# Patient Record
Sex: Male | Born: 1980 | Race: Black or African American | Hispanic: No | Marital: Single | Smoking: Current every day smoker
Health system: Southern US, Community
[De-identification: ages and names within clinical notes are randomized; demographics above are authoritative.]

## PROBLEM LIST (undated history)

## (undated) DIAGNOSIS — F419 Anxiety disorder, unspecified: Secondary | ICD-10-CM

## (undated) DIAGNOSIS — F32A Depression, unspecified: Secondary | ICD-10-CM

## (undated) DIAGNOSIS — G8929 Other chronic pain: Secondary | ICD-10-CM

## (undated) DIAGNOSIS — F329 Major depressive disorder, single episode, unspecified: Secondary | ICD-10-CM

## (undated) DIAGNOSIS — M549 Dorsalgia, unspecified: Secondary | ICD-10-CM

## (undated) HISTORY — PX: FOOT SURGERY: SHX648

---
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
## 2001-12-22 ENCOUNTER — Encounter: Payer: Self-pay | Admitting: Emergency Medicine

## 2001-12-22 ENCOUNTER — Emergency Department (HOSPITAL_COMMUNITY): Admission: EM | Admit: 2001-12-22 | Discharge: 2001-12-22 | Payer: Self-pay | Admitting: Emergency Medicine

## 2005-09-13 ENCOUNTER — Emergency Department (HOSPITAL_COMMUNITY): Admission: EM | Admit: 2005-09-13 | Discharge: 2005-09-13 | Payer: Self-pay | Admitting: Emergency Medicine

## 2007-03-22 ENCOUNTER — Emergency Department (HOSPITAL_COMMUNITY): Admission: EM | Admit: 2007-03-22 | Discharge: 2007-03-22 | Payer: Self-pay | Admitting: Emergency Medicine

## 2007-04-23 ENCOUNTER — Emergency Department (HOSPITAL_COMMUNITY): Admission: EM | Admit: 2007-04-23 | Discharge: 2007-04-23 | Payer: Self-pay | Admitting: *Deleted

## 2007-04-28 ENCOUNTER — Emergency Department (HOSPITAL_COMMUNITY): Admission: EM | Admit: 2007-04-28 | Discharge: 2007-04-28 | Payer: Self-pay | Admitting: Emergency Medicine

## 2007-07-27 ENCOUNTER — Emergency Department (HOSPITAL_COMMUNITY): Admission: EM | Admit: 2007-07-27 | Discharge: 2007-07-28 | Payer: Self-pay | Admitting: Emergency Medicine

## 2008-12-21 ENCOUNTER — Emergency Department (HOSPITAL_COMMUNITY): Admission: EM | Admit: 2008-12-21 | Discharge: 2008-12-21 | Payer: Self-pay | Admitting: Family Medicine

## 2009-04-29 ENCOUNTER — Encounter (INDEPENDENT_AMBULATORY_CARE_PROVIDER_SITE_OTHER): Payer: Self-pay | Admitting: *Deleted

## 2009-07-02 ENCOUNTER — Ambulatory Visit (HOSPITAL_COMMUNITY): Admission: RE | Admit: 2009-07-02 | Discharge: 2009-07-02 | Payer: Self-pay | Admitting: Family Medicine

## 2009-07-02 ENCOUNTER — Ambulatory Visit: Payer: Self-pay | Admitting: Family Medicine

## 2009-07-02 DIAGNOSIS — F411 Generalized anxiety disorder: Secondary | ICD-10-CM | POA: Insufficient documentation

## 2009-07-02 DIAGNOSIS — F39 Unspecified mood [affective] disorder: Secondary | ICD-10-CM | POA: Insufficient documentation

## 2009-07-07 ENCOUNTER — Encounter: Payer: Self-pay | Admitting: Family Medicine

## 2009-08-21 ENCOUNTER — Encounter (INDEPENDENT_AMBULATORY_CARE_PROVIDER_SITE_OTHER): Payer: Self-pay | Admitting: *Deleted

## 2009-08-21 DIAGNOSIS — F17200 Nicotine dependence, unspecified, uncomplicated: Secondary | ICD-10-CM | POA: Insufficient documentation

## 2009-08-25 ENCOUNTER — Emergency Department (HOSPITAL_COMMUNITY): Admission: EM | Admit: 2009-08-25 | Discharge: 2009-08-26 | Payer: Self-pay | Admitting: Emergency Medicine

## 2010-05-26 ENCOUNTER — Ambulatory Visit: Payer: Self-pay | Admitting: Family Medicine

## 2010-05-26 DIAGNOSIS — H6590 Unspecified nonsuppurative otitis media, unspecified ear: Secondary | ICD-10-CM | POA: Insufficient documentation

## 2010-06-02 ENCOUNTER — Telehealth: Payer: Self-pay | Admitting: *Deleted

## 2010-06-08 ENCOUNTER — Emergency Department (HOSPITAL_COMMUNITY): Admission: EM | Admit: 2010-06-08 | Discharge: 2010-06-08 | Payer: Self-pay | Admitting: Family Medicine

## 2010-07-11 ENCOUNTER — Ambulatory Visit: Payer: Self-pay | Admitting: Family Medicine

## 2010-08-04 ENCOUNTER — Encounter: Payer: Self-pay | Admitting: Family Medicine

## 2010-08-04 ENCOUNTER — Ambulatory Visit: Payer: Self-pay | Admitting: Family Medicine

## 2010-08-04 DIAGNOSIS — J329 Chronic sinusitis, unspecified: Secondary | ICD-10-CM | POA: Insufficient documentation

## 2010-08-04 DIAGNOSIS — F1210 Cannabis abuse, uncomplicated: Secondary | ICD-10-CM | POA: Insufficient documentation

## 2010-08-04 LAB — CONVERTED CEMR LAB
HDL: 44 mg/dL (ref >39)
LDL Cholesterol: 108 mg/dL — ABNORMAL HIGH (ref 0–99)
Total CHOL/HDL Ratio: 3.8

## 2010-08-08 ENCOUNTER — Encounter: Payer: Self-pay | Admitting: Family Medicine

## 2010-12-15 NOTE — Assessment & Plan Note (Signed)
Summary: cpe,df   Vital Signs:  Patient profile:   30 year old male Height:      65.5 inches Weight:      153 pounds BMI:     25.16 BSA:     1.78 Temp:     99.0 degrees F Pulse rate:   85 / minute BP sitting:   91 / 50  Vitals Entered By: Jone Baseman CMA (August 04, 2010 10:14 AM) CC: CPE Is Patient Diabetic? No Pain Assessment Patient in pain? no        Primary Care Provider:  . [facility]  CC:  CPE.  History of Present Illness: Annual physical  Concerns 1. Chronic sinusitis:  Has had this for the past couple of months.  Was seen here and given Flonase which helps some but it hasn't gone away.  Seems to be worse at night and he gets very congested at night.  Health maintanence 1. Tobacco use:  Smokes 1 pack in 1 week.  Willing to try to cut down 2. Marijuana use:  uses it to help him calm down.  Feels like he has anxiety issues. 3. Exercise:  goes to the gym regularly 4. Diet: Standard American Diet  Habits & Providers  Alcohol-Tobacco-Diet     Alcohol drinks/day: <1     Alcohol Counseling: not indicated; use of alcohol is not excessive or problematic     Alcohol type: beer     >5/day in last 3 mos: no     Tobacco Status: current     Tobacco Counseling: to quit use of tobacco products     Cigarette Packs/Day: 0.25     Year Started: 1997     Pack years: 6  Current Medications (verified): 1)  Fluticasone Propionate 50 Mcg/act Susp (Fluticasone Propionate) .... 2 Sprays Each Nostril Once Daily. 2)  Zyrtec Hives Relief 10 Mg Tabs (Cetirizine Hcl) .Marland Kitchen.. 1 Tab By Mouth Daily At Night 3)  Fluoxetine Hcl 10 Mg Caps (Fluoxetine Hcl) .Marland Kitchen.. 1 Tab By Mouth Daily To Help With Anxiety  Allergies: No Known Drug Allergies  Past History:  Past Medical History: Reviewed history from 05/26/2010 and no changes required. Blood transfusion- at birth Depression/Anxiety vs mood disorder  TOBACCO USER (ICD-305.1)  Social History: Reviewed history from 07/02/2009  and no changes required. Lives with wife Jermond Burkemper dob 04/20/1981) and daughter Scott Vanderveer dob 10/28/2008).  Works as Administrator.  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  Smokes 1-2 cigarettes per day.  Drinks beer or liquor on special occassions.  Uses MJ daily.  Lifts weight for approximately 20 minutes 4 times per week.    Review of Systems  The patient denies fever, weight loss, chest pain, dyspnea on exertion, prolonged cough, headaches, and abdominal pain.    Physical Exam  General:  vitals reviewed.  well appearing, no acute distress Eyes:  normal conjunctiva Ears:  TM normal bilaterally Nose:  no sinus pain or pressure.  Some nasal mucosal erythema Mouth:  Oral mucosa and oropharynx without lesions or exudates.  Teeth in good repair. Neck:  No deformities, masses, or tenderness noted. Lungs:  Normal respiratory effort, chest expands symmetrically. Lungs are clear to auscultation, no crackles or wheezes. Heart:  normal rate and regular rhythm.   Abdomen:  Bowel sounds positive,abdomen soft and non-tender without masses, organomegaly or hernias noted. Msk:  normal ROM.   Extremities:  No clubbing, cyanosis, edema, or deformity noted with normal full range of motion of all joints.  Neurologic:  No cranial nerve deficits noted. Station and gait are normal. Plantar reflexes are down-going bilaterally. DTRs are symmetrical throughout. Sensory, motor and coordinative functions appear intact. Psych:  not anxious appearing and not depressed appearing.     Impression & Recommendations:  Problem # 1:  PHYSICAL EXAMINATION (ICD-V70.0) Assessment Unchanged  Will check lipids.  Orders: [facility] - Est  18-39 yrs ([address]35)  Problem # 2:  TOBACCO USER (ICD-305.1) Assessment: Unchanged Advised to quit which he will try to do  Problem # 3:  CANNABIS ABUSE (ICD-305.20) Will start Prozac to help with anxiety and see if tha can help him cut down.  Advised him to give the Prozac 6  weeks to start working and then try to cut back on the MJ.  Problem # 4:  SINUSITIS, CHRONIC (ICD-473.9) Assessment: New Will treat with Zyrtec and Flonase The following medications were removed from the medication list:    Doxycycline Hyclate 100 Mg Tabs (Doxycycline hyclate) ..... One two times a day for 7 days His updated medication list for this problem includes:    Fluticasone Propionate 50 Mcg/act Susp (Fluticasone propionate) .Marland Kitchen... 2 sprays each nostril once daily.  Complete Medication List: 1)  Fluticasone Propionate 50 Mcg/act Susp (Fluticasone propionate) .... 2 sprays each nostril once daily. 2)  Zyrtec Hives Relief 10 Mg Tabs (Cetirizine hcl) .Marland Kitchen.. 1 tab by mouth daily at night 3)  Fluoxetine Hcl 10 Mg Caps (Fluoxetine hcl) .Marland Kitchen.. 1 tab by mouth daily to help with anxiety  Other Orders: Lipid-[facility] ([id number])  Patient Instructions: 1)  It was nice to meet you  2)  We will get some baseline blood work today 3)  I am going to start you on an allergy medicine for your sinuses 4)  I am also going to start you on another medicine to help calm you down 5)  If you can try and cut back on your smoking 6)  Please schedule a follow up appointment in 2 months to see how you are doing Prescriptions: FLUOXETINE HCL 10 MG CAPS (FLUOXETINE HCL) 1 tab by mouth daily to help with anxiety  #30 x 3   Entered and Authorized by:   Angelena Sole MD   Signed by:   Angelena Sole MD on 08/04/2010   Method used:   Electronically to        [facility] [address] (retail)       [phone]       [place], [place]  [address]       Ph: [phone]       Fax: [phone]   RxID:   [id number] ZYRTEC HIVES RELIEF 10 MG TABS (CETIRIZINE HCL) 1 tab by mouth daily at night  #30 x 3   Entered and Authorized by:   Angelena Sole MD   Signed by:   Angelena Sole MD on 08/04/2010   Method used:   Electronically to        [facility] [address]  (retail)       [phone]       [place], [place]  [address]       Ph: [phone]       Fax: [phone]   RxID:   [id number]   Prevention & Chronic Care Immunizations   Influenza vaccine: Not documented    Tetanus booster: 07/02/2009: Tdap    Pneumococcal vaccine: Not documented  Other  Screening   Smoking status: current  (08/04/2010)

## 2010-12-15 NOTE — Letter (Signed)
Summary: Generic Letter  [facility]  [address]   [place], [place] [address]   Phone: [phone]  Fax: [phone]    08/08/2010  Justin Riggs [address] [place], [place]  [address]  Dear Mr. Fidalgo,  Here is your cholesterol lab results.  Everything looked good.  Tests: (1) Lipid Profile (01027)   Cholesterol               166 mg/dL                   2-536   Triglyceride              72 mg/dL                    <644   HDL Cholesterol           44 mg/dL                    >03   Total Chol/HDL Ratio      3.8 Ratio   VLDL Cholesterol (Calc)    14 mg/dL                    4-74   LDL Cholesterol (Calc)[H]  108 mg/dL                   2-59           Sincerely,   Angelena Sole MD  Appended Document: Generic Letter mailed.  Appended Document: Generic Letter Letter returned unable to forward.

## 2010-12-15 NOTE — Assessment & Plan Note (Signed)
Summary: sinus problems/eo   Vital Signs:  Patient profile:   30 year old male Height:      67 inches Weight:      154.6 pounds BMI:     24.30 Temp:     98.5 degrees F oral Pulse rate:   79 / minute BP sitting:   110 / 68  (right arm) Cuff size:   regular  Vitals Entered By: Garen Grams LPN (July 11, 2010 10:32 AM) CC: headache, ear pain, drainage, sore throat Is Patient Diabetic? No   Primary Care Provider:  . [facility]  CC:  headache, ear pain, drainage, and sore throat.  History of Present Illness: Highly anxious about earache and sinus problems.  Smokes but does not want to quit.  Seen 5 weeks ago and treated with nasal steroids and symptoms improved.  He quit using the nasal spray and does not want to have to pay for it, thought it was too expensive.  Denies fever.  Habits & Providers  Alcohol-Tobacco-Diet     Tobacco Status: current     Tobacco Counseling: to quit use of tobacco products  Current Medications (verified): 1)  Fluticasone Propionate 50 Mcg/act Susp (Fluticasone Propionate) .... 2 Sprays Each Nostril Once Daily. 2)  Doxycycline Hyclate 100 Mg Tabs (Doxycycline Hyclate) .... One Two Times A Day For 7 Days  Allergies: No Known Drug Allergies  Review of Systems General:  Denies chills and fever. ENT:  Complains of ear discharge, earache, nasal congestion, ringing in ears, and sore throat. Resp:  Denies cough and wheezing.  Physical Exam  General:  Well-developed,well-nourished,in no acute distress; alert,appropriate and cooperative throughout examination Eyes:  clear no drainage Ears:  TM red and retracted Nose:  mucosal erythema.   Mouth:  pharyngeal erythema.   Neck:  No deformities, masses, or tenderness noted. Lungs:  normal respiratory effort and normal breath sounds.   Heart:  normal rate and regular rhythm.     Impression & Recommendations:  Problem # 1:  SINUSITIS, ACUTE (ICD-461.9) counseled to quit smoking and to use nasal  steroid (dobut he will refill ), treat with one week of doxy, since this has been going on for 5 weeks. His updated medication list for this problem includes:    Fluticasone Propionate 50 Mcg/act Susp (Fluticasone propionate) .Marland Kitchen... 2 sprays each nostril once daily.    Doxycycline Hyclate 100 Mg Tabs (Doxycycline hyclate) ..... One two times a day for 7 days  Orders: [facility]- Est Level  3 ([address]191)  Complete Medication List: 1)  Fluticasone Propionate 50 Mcg/act Susp (Fluticasone propionate) .... 2 sprays each nostril once daily. 2)  Doxycycline Hyclate 100 Mg Tabs (Doxycycline hyclate) .... One two times a day for 7 days  Patient Instructions: 1)  Zyrtec (cetirizine 10 mg daily) at bedtime Prescriptions: DOXYCYCLINE HYCLATE 100 MG TABS (DOXYCYCLINE HYCLATE) one two times a day for 7 days  #20 x 0   Entered and Authorized by:   Luretha Murphy NP   Signed by:   Luretha Murphy NP on 07/11/2010   Method used:   Electronically to        [facility] [address] (retail)       [phone]       [place], [place]  [address]       Ph: [phone]       Fax: [phone]   RxID:   [id number]

## 2010-12-15 NOTE — Assessment & Plan Note (Signed)
Summary: bloating for 2 wks/ear ache/blue team pt/eo   Vital Signs:  Patient profile:   30 year old male Weight:      153 pounds BMI:     24.05 Temp:     98.4 degrees F oral Pulse rate:   74 / minute BP sitting:   110 / 80  (left arm)  Vitals Entered By: Jimmy Footman, CMA (May 26, 2010 9:44 AM) CC: ear pain/popping feeling x3weeks Is Patient Diabetic? No Pain Assessment Patient in pain? yes     Location: left ear Intensity: 4 Type: aching, popping   Primary Care Provider:  . [facility]  CC:  ear pain/popping feeling x3weeks.  History of Present Illness: ear: left ear with some pain, pressure sensation, popping now for about 3 weeks.  also states it feels like there is fluid in there and like he is hearing an echo.  he noted it was particularly worse when he dove to the bottom of a swimming pool.  he has tried tilting his head to the side to see if there was anything in there but it didn't help.  he denies fevers, denies drainage from the ear.  he's had no sick contacts.  he denies sneezing, runny nose or cough but has noted a little congestion.  he hasn't been using qtips in his ears.      Habits & Providers  Alcohol-Tobacco-Diet     Tobacco Status: current     Tobacco Counseling: to quit use of tobacco products  Current Medications (verified): 1)  Fluticasone Propionate 50 Mcg/act Susp (Fluticasone Propionate) .... 2 Sprays Each Nostril Once Daily.  Allergies (verified): No Known Drug Allergies  Past History:  Past Medical History: Blood transfusion- at birth Depression/Anxiety vs mood disorder  TOBACCO USER (ICD-305.1)  Review of Systems       per HPI.  does note however he has been under a great deal of stress recently.  denies SI/HI.  reports some appetite changes and also sleeping difficulties.  didn't take med previously prescribed for long because it made him too sleepy  Physical Exam  General:  Well-developed,well-nourished,in no acute distress;  alert,appropriate and cooperative throughout examination VS noted - WNL Head:  Normocephalic and atraumatic without obvious abnormalities.  Ears:  left ear with clear fluid posterior to the TM with mild bulging.  canal clear.  no erythema.   R ear with TM WNL Nose:  mild bogginess of turbinates. Mouth:  Oral mucosa and oropharynx without lesions or exudates.  Teeth in good repair.   Impression & Recommendations:  Problem # 1:  OTITIS MEDIA, SEROUS, LEFT (ICD-381.4) Assessment New  advised decongestants, also nasal steroid.  return if worsens.   no indication for abx at this time.  Orders: [facility]- Est Level  3 (04540)  Complete Medication List: 1)  Fluticasone Propionate 50 Mcg/act Susp (Fluticasone propionate) .... 2 sprays each nostril once daily.  Patient Instructions: 1)  For the ear I would recommend using a claritin-D, zyrtec-D or allergra-D (the kind you have to ask for at the pharmacy)  daily for the next few weeks to help open up your nose so the ears can drain. 2)  I also want you to use a nasal spray daily - it doesn't work instantly and often takes about a month to really see full effect so keep using it.   3)  Please schedule an appt with Dr Pascal Lux to discuss your stress and mood problems.   4)  Also schedule an  appt for a complete physical at some point.  Prescriptions: FLUTICASONE PROPIONATE 50 MCG/ACT SUSP (FLUTICASONE PROPIONATE) 2 sprays each nostril once daily.  #1 x 6   Entered and Authorized by:   Ancil Boozer  MD   Signed by:   Ancil Boozer  MD on 05/26/2010   Method used:   Electronically to        [facility] [address] (retail)       [phone]       [place], [place]  [address]       Ph: [phone]       Fax: [phone]   RxID:   [id number]

## 2010-12-15 NOTE — Progress Notes (Signed)
Summary: triage  Phone Note Call from Patient Call back at [phone]   Caller: Patient Summary of Call: Was just here a week ago and had fluid in ears got nasal spray, but now having sharpe pains in his ear and neck pain on both sides.   Initial call taken by: Clydell Hakim,  June 02, 2010 8:47 AM  Follow-up for Phone Call        LM Follow-up by: Golden Circle RN,  June 02, 2010 8:51 AM  Additional Follow-up for Phone Call Additional follow up Details #1::        he is unable to come in today. will be here at 8:30 workin. has not taken any meds. urged him to take tylenol or ib with food until seen Additional Follow-up by: Golden Circle RN,  June 02, 2010 10:46 AM

## 2010-12-20 ENCOUNTER — Emergency Department (HOSPITAL_COMMUNITY)
Admission: EM | Admit: 2010-12-20 | Discharge: 2010-12-20 | Disposition: A | Discharge status: Home / Self Care | Payer: BC Managed Care – PPO | Attending: Emergency Medicine | Admitting: Emergency Medicine

## 2010-12-20 DIAGNOSIS — H00019 Hordeolum externum unspecified eye, unspecified eyelid: Secondary | ICD-10-CM | POA: Insufficient documentation

## 2011-08-25 LAB — CBC
HCT: 49
Hemoglobin: 16.6
MCHC: 34
MCV: 94.5
RDW: 13.2

## 2011-08-25 LAB — URINALYSIS, ROUTINE W REFLEX MICROSCOPIC
Ketones, ur: 40 — AB
Leukocytes, UA: NEGATIVE
Nitrite: NEGATIVE
Protein, ur: 100 — AB
pH: 6

## 2011-08-25 LAB — DIFFERENTIAL
Basophils Relative: 0
Lymphocytes Relative: 17
Lymphs Abs: 0.9
Monocytes Relative: 8
Neutro Abs: 4
Neutrophils Relative %: 75

## 2011-08-25 LAB — COMPREHENSIVE METABOLIC PANEL
Alkaline Phosphatase: 63
BUN: 11
Calcium: 9.9
Creatinine, Ser: 1.12
Glucose, Bld: 112 — ABNORMAL HIGH
Total Protein: 8.6 — ABNORMAL HIGH

## 2011-08-25 LAB — URINE MICROSCOPIC-ADD ON

## 2011-08-31 LAB — URINALYSIS, ROUTINE W REFLEX MICROSCOPIC
Nitrite: NEGATIVE
Specific Gravity, Urine: 1.045 — ABNORMAL HIGH
Urobilinogen, UA: 1

## 2011-08-31 LAB — RAPID URINE DRUG SCREEN, HOSP PERFORMED
Amphetamines: NOT DETECTED
Tetrahydrocannabinol: POSITIVE — AB

## 2011-08-31 LAB — CBC
Hemoglobin: 15.2
RDW: 13.3

## 2011-08-31 LAB — DIFFERENTIAL
Basophils Absolute: 0
Lymphocytes Relative: 25
Monocytes Absolute: 0.6
Neutro Abs: 3.7

## 2011-08-31 LAB — I-STAT 8, (EC8 V) (CONVERTED LAB)
Acid-base deficit: 2
Bicarbonate: 21.4
Chloride: 106
HCT: 50
Operator id: 279831
pCO2, Ven: 32.4 — ABNORMAL LOW

## 2011-08-31 LAB — URINE MICROSCOPIC-ADD ON

## 2011-08-31 LAB — POCT I-STAT CREATININE: Creatinine, Ser: 1.5

## 2011-08-31 LAB — SALICYLATE LEVEL: Salicylate Lvl: 8.5

## 2011-08-31 LAB — ACETAMINOPHEN LEVEL: Acetaminophen (Tylenol), Serum: 35.9 — ABNORMAL HIGH

## 2011-08-31 LAB — ETHANOL: Alcohol, Ethyl (B): <5

## 2012-04-08 ENCOUNTER — Emergency Department (HOSPITAL_COMMUNITY): Payer: Self-pay

## 2012-04-08 ENCOUNTER — Emergency Department (HOSPITAL_COMMUNITY)
Admission: EM | Admit: 2012-04-08 | Discharge: 2012-04-08 | Disposition: A | Discharge status: Home / Self Care | Payer: Self-pay | Attending: Emergency Medicine | Admitting: Emergency Medicine

## 2012-04-08 ENCOUNTER — Encounter (HOSPITAL_COMMUNITY): Payer: Self-pay | Admitting: Emergency Medicine

## 2012-04-08 DIAGNOSIS — S62339A Displaced fracture of neck of unspecified metacarpal bone, initial encounter for closed fracture: Secondary | ICD-10-CM | POA: Insufficient documentation

## 2012-04-08 DIAGNOSIS — X838XXA Intentional self-harm by other specified means, initial encounter: Secondary | ICD-10-CM | POA: Insufficient documentation

## 2012-04-08 DIAGNOSIS — S62309A Unspecified fracture of unspecified metacarpal bone, initial encounter for closed fracture: Secondary | ICD-10-CM

## 2012-04-08 NOTE — ED Notes (Signed)
Paged ortho requesting finger splint and wrist splint.

## 2012-04-08 NOTE — ED Notes (Signed)
Pt sts punched wall 4 days ago with right fist and now c/o continued pain and swelling; pt sts some improvemetn

## 2012-04-08 NOTE — ED Provider Notes (Addendum)
History   This chart was scribed for Gwyneth Sprout, MD by Brooks Sailors. The patient was seen in room CDU11/CDU11. Patient's care was started at 1108.   CSN: [id number]  Arrival date & time 04/08/12  1108   First MD Initiated Contact with Patient 04/08/12 1139      Chief Complaint  Patient presents with  . Hand Pain    (Consider location/radiation/quality/duration/timing/severity/associated sxs/prior treatment) HPI  Justin Riggs is a 31 y.o. male who presents to the Emergency Department complaining of constant severe right hand pain following an injury 4 days ago with associated swelling. Patient punched a wall. Pt says he has normal sensations and movement. Nothing makes it worse and nothing makes the pain better. Pt notes that swelling has minimally improved.    History reviewed. No pertinent past medical history.  History reviewed. No pertinent past surgical history.  History reviewed. No pertinent family history.  History  Substance Use Topics  . Smoking status: Current Everyday Smoker  . Smokeless tobacco: Not on file  . Alcohol Use: Yes      Review of Systems  All other systems reviewed and are negative.    Allergies  Review of patient's allergies indicates no known allergies.  Home Medications   No current outpatient prescriptions on file.  BP 114/65  Pulse 75  Temp(Src) 98 F (36.7 C) (Oral)  Resp 20  SpO2 97%  Physical Exam  Nursing note and vitals reviewed. Constitutional: He is oriented to person, place, and time. He appears well-developed and well-nourished. No distress.  HENT:  Head: Normocephalic and atraumatic.  Eyes: EOM are normal.  Neck: Neck supple. No tracheal deviation present.  Cardiovascular: Normal rate.   Pulmonary/Chest: Effort normal. No respiratory distress.  Musculoskeletal: Normal range of motion.       Right hand: He exhibits tenderness.       Good grip strength, Normal cap refill, No finger deviation with making  a fist, Normal sensation, Good pulse, no elbow or wrist pain.   Neurological: He is alert and oriented to person, place, and time.  Skin: Skin is warm and dry.  Psychiatric: He has a normal mood and affect. His behavior is normal.    ED Course  Procedures (including critical care time)  Pt seen at 1150, will be sent for right hand x-ray soon.  1300 Pt informed of xray results and course of care.   Labs Reviewed - No data to display Dg Hand Complete Right  04/08/2012  *RADIOLOGY REPORT*  Clinical Data: Injury  RIGHT HAND - COMPLETE 3+ VIEW  Comparison: None.  Findings: Luno-triquetral coalition. Acute fracture involving the head of the second metacarpal.  Small bony fragments are minimally displaced.  IMPRESSION: Acute fracture of the distal second metacarpal.  Original Report Authenticated By: Donavan Burnet, M.D.     1. Metacarpal bone fracture       MDM   Patient with mechanical injury to his hand after he punched a wall 5 days ago. Continue swelling and pain distal to his second MCP joint. He has normal pulses and sensation. Good movements and no deviation of his fingers when making a fist. Plain film shows an acute fracture of the distal second metacarpal without angulation or other complicating features. Patient was placed in a finger splint and Velcro wrist splint as he did not want a plaster cast due to his line of work.      I personally performed the services described in this documentation, which  was scribed in my presence.  The recorded information has been reviewed and considered.    Gwyneth Sprout, MD 04/08/12 1306  Gwyneth Sprout, MD 04/08/12 0865

## 2012-04-08 NOTE — Progress Notes (Signed)
Orthopedic Tech Progress Note Patient Details:  Justin Riggs 1980-12-11 [id number]  Ortho Devices Type of Ortho Device: Finger splint;Velcro wrist splint Ortho Device/Splint Location: (R) UE Ortho Device/Splint Interventions: Application   [name] 04/08/2012, 1:23 PM

## 2012-04-08 NOTE — Discharge Instructions (Signed)
Cast or Splint Care Casts and splints support injured limbs and keep bones from moving while they heal.  HOME CARE  Keep the cast or splint uncovered during the drying period.   A plaster cast can take 24 to 48 hours to dry.   A fiberglass cast will dry in less than 1 hour.   Do not rest the cast on anything harder than a pillow for 24 hours.   Do not put weight on your injured limb. Do not put pressure on the cast. Wait for your doctor's approval.   Keep the cast or splint dry.   Cover the cast or splint with a plastic bag during baths or wet weather.   If you have a cast over your chest and belly (trunk), take sponge baths until the cast is taken off.   Keep your cast or splint clean. Wash a dirty cast with a damp cloth.   Do not put any objects under your cast or splint. Do not scratch the skin under the cast with an object.   Do not take out the padding from inside your cast.   Exercise your joints near the cast as told by your doctor.   Raise (elevate) your injured limb on 1 or 2 pillows for the first 1 to 3 days.  GET HELP RIGHT AWAY IF:  Your cast or splint cracks.   Your cast or splint is too tight or too loose.   You itch badly under the cast.   Your cast gets wet or has a soft spot.   You have a bad smell coming from the cast.   You get an object stuck under the cast.   Your skin around the cast becomes red or raw.   You have new or more pain after the cast is put on.   You have fluid leaking through the cast.   You cannot move your fingers or toes.   Your fingers or toes turn colors or are cool, painful, or puffy (swollen).   You have tingling or lose feeling (numbness) around the injured area.   You have pain or pressure under the cast.   You have trouble breathing or have shortness of breath.   You have chest pain.  MAKE SURE YOU:  Understand these instructions.   Will watch your condition.   Will get help right away if you are not doing  well or get worse.  Document Released: 03/01/2011 Document Revised: 10/19/2011 Document Reviewed: 03/01/2011 Exit[employer] Patient Information 2012 [employer].Hand Fracture Your caregiver has diagnosed you with a fractured (broken) bone in your hand. If the bones are in good position and the hand is properly immobilized and rested, these injuries will usually heal in 3 to 6 weeks. A cast, splint, or bulky bandage is usually applied to keep the fracture site from moving. Do not remove the splint or cast until your caregiver approves. If the fracture is unstable or the bones are not aligned properly, surgery may be needed. Keep your hand raised (elevated) above the level of your heart as much as possible for the next 2 to 3 days until the swelling and pain are better. Apply ice packs for 15 to 20 minutes every 3 to 4 hours to help control the pain and swelling. See your caregiver or an orthopedic specialist as directed for follow-up care to make sure the fracture is beginning to heal properly. SEEK IMMEDIATE MEDICAL CARE IF:   You notice your fingers are cold, numb,   crooked, or the pain of your injury is severe.   You are not improving or seem to be getting worse.   You have questions or concerns.  Document Released: 12/07/2004 Document Revised: 10/19/2011 Document Reviewed: 04/27/2009 Exit[employer] Patient Information 2012 [employer]. 

## 2012-04-08 NOTE — ED Notes (Signed)
PT HAS RETURNED FROM XRAY. AWAIT RESULTS

## 2012-12-16 ENCOUNTER — Encounter (HOSPITAL_COMMUNITY): Payer: Self-pay | Admitting: Emergency Medicine

## 2012-12-16 ENCOUNTER — Emergency Department (HOSPITAL_COMMUNITY): Payer: BC Managed Care – PPO

## 2012-12-16 ENCOUNTER — Emergency Department (HOSPITAL_COMMUNITY)
Admission: EM | Admit: 2012-12-16 | Discharge: 2012-12-16 | Disposition: A | Discharge status: Home / Self Care | Payer: BC Managed Care – PPO | Attending: Emergency Medicine | Admitting: Emergency Medicine

## 2012-12-16 DIAGNOSIS — R079 Chest pain, unspecified: Secondary | ICD-10-CM

## 2012-12-16 DIAGNOSIS — R072 Precordial pain: Secondary | ICD-10-CM | POA: Insufficient documentation

## 2012-12-16 DIAGNOSIS — J029 Acute pharyngitis, unspecified: Secondary | ICD-10-CM | POA: Insufficient documentation

## 2012-12-16 DIAGNOSIS — F17200 Nicotine dependence, unspecified, uncomplicated: Secondary | ICD-10-CM | POA: Insufficient documentation

## 2012-12-16 MED ORDER — NAPROXEN 500 MG PO TABS: 500.0000 mg | ORAL_TABLET | Freq: Two times a day (BID) | ORAL | Status: DC

## 2012-12-16 MED ORDER — KETOROLAC TROMETHAMINE 60 MG/2ML IM SOLN
60.0000 mg | Freq: Once | INTRAMUSCULAR | Status: AC
  Administered 2012-12-16: 60 mg via INTRAMUSCULAR
  Filled 2012-12-16: qty 2

## 2012-12-16 MED ORDER — CYCLOBENZAPRINE HCL 5 MG PO TABS: 5.0000 mg | ORAL_TABLET | Freq: Three times a day (TID) | ORAL | Status: DC | PRN

## 2012-12-16 NOTE — ED Provider Notes (Signed)
History     CSN: [id number]  Arrival date & time 12/16/12  0454   First MD Initiated Contact with Patient 12/16/12 [phone]      Chief Complaint  Patient presents with  . Sore Throat  . Chest Pain    (Consider location/radiation/quality/duration/timing/severity/associated sxs/prior treatment) HPI Comments: 32 year old male who states that 3 days ago he started to have pain in the middle of his chest after eating a very large meal. He did not have any food instructions and has been able to eat and drink since that time. He complains of intermittent shooting pains that start in his midchest and run up his throat into his years. Nothing seems to make this come on, nothing seems to make it go away, it lasts only 2-3 seconds and then completely resolves. He has no coughing, no shortness of breath, no nausea or vomiting and no other headaches, sore throat, nasal congestion, blurred vision, weakness, numbness, change in bowel habits or bladder habits. He has no other significant medical problems.  The history is provided by the patient.    History reviewed. No pertinent past medical history.  Past Surgical History  Procedure Date  . Foot surgery     right foot    History reviewed. No pertinent family history.  History  Substance Use Topics  . Smoking status: Current Every Day Smoker -- 0.5 packs/day  . Smokeless tobacco: Not on file  . Alcohol Use: Yes      Review of Systems  All other systems reviewed and are negative.    Allergies  Review of patient's allergies indicates no known allergies.  Home Medications   Current Outpatient Rx  Name  Route  Sig  Dispense  Refill  . CALCIUM CARBONATE ANTACID 500 MG PO CHEW   Oral   Chew 2 tablets by mouth daily as needed. For stomach pain         . ADULT MULTIVITAMIN W/MINERALS CH   Oral   Take 1 tablet by mouth daily.         . CYCLOBENZAPRINE HCL 5 MG PO TABS   Oral   Take 1 tablet (5 mg total) by mouth 3 (three) times  daily as needed for muscle spasms.   30 tablet   0   . NAPROXEN 500 MG PO TABS   Oral   Take 1 tablet (500 mg total) by mouth 2 (two) times daily with a meal.   30 tablet   0     BP 103/53  Pulse 72  Temp 98.3 F (36.8 C) (Oral)  Resp 17  SpO2 97%  Physical Exam  Nursing note and vitals reviewed. Constitutional: He appears well-developed and well-nourished. No distress.  HENT:  Head: Normocephalic and atraumatic.  Mouth/Throat: Oropharynx is clear and moist. No oropharyngeal exudate.       Oropharynx is clear, mucous membranes are moist, no signs of exudate, asymmetry, hypertrophy or erythema of the posterior pharynx.  Tympanic membranes are clear bilaterally, nasal passages are clear. No trismus, no torticollis, patient is able to lift his tongue is no tenderness underneath his tongue his teeth.  Eyes: Conjunctivae normal and EOM are normal. Pupils are equal, round, and reactive to light. Right eye exhibits no discharge. Left eye exhibits no discharge. No scleral icterus.  Neck: Normal range of motion. Neck supple. No JVD present. No thyromegaly present.       Very supple neck without lymphadenopathy. He is able to move his chin in all directions without  any pain  Cardiovascular: Normal rate, regular rhythm, normal heart sounds and intact distal pulses.  Exam reveals no gallop and no friction rub.   No murmur heard. Pulmonary/Chest: Effort normal and breath sounds normal. No respiratory distress. He has no wheezes. He has no rales. He exhibits no tenderness.  Abdominal: Soft. Bowel sounds are normal. He exhibits no distension and no mass. There is no tenderness.  Musculoskeletal: Normal range of motion. He exhibits no edema and no tenderness.  Lymphadenopathy:    He has no cervical adenopathy.  Neurological: He is alert. Coordination normal.  Skin: Skin is warm and dry. No rash noted. No erythema.  Psychiatric: He has a normal mood and affect. His behavior is normal.    ED  Course  Procedures (including critical care time)   Labs Reviewed  RAPID STREP SCREEN   Dg Chest 2 View  12/16/2012  *RADIOLOGY REPORT*  Clinical Data: Mid chest pain and throat pain for 3 days.  CHEST - 2 VIEW  Comparison: None.  Findings: The heart size and pulmonary vascularity are normal. The lungs appear clear and expanded without focal air space disease or consolidation. No blunting of the costophrenic angles.  No pneumothorax.  Mediastinal contours appear intact.  IMPRESSION: No evidence of active pulmonary disease.   Original Report Authenticated By: Burman Nieves, M.D.      1. Chest pain       MDM  The patient has had several episodes of this pain while at examining him. He appears overall very comfortable, has normal vital signs and an EKG which is completely unremarkable. Will obtain a chest x-ray to help with evaluation of chest pain including to rule out pneumothorax, could possibly be esophageal spasm.  ED ECG REPORT  I personally interpreted this EKG   Date: 12/16/2012   Rate: 90  Rhythm: normal sinus rhythm  QRS Axis: normal  Intervals: normal  ST/T Wave abnormalities: normal  Conduction Disutrbances:none  Narrative Interpretation:   Old EKG Reviewed: none available  Patient feels much better after medications, appear stable, chest x-ray negative, patient appears stable for discharge.  PA and lateral views of the chest were obtained by digital radiography. I have personally interpreted these x-rays and find her to be no signs of pulmonary infiltrate, cardiomegaly, subdiaphragmatic free air, soft tissue abnormality, no obvious bony abnormalities or fractures.   Discharge Prescriptions include:   Naprosyn  Flexeril      Vida Roller, MD 12/16/12 [phone]

## 2012-12-16 NOTE — ED Notes (Signed)
Pt c/o sore throat and ear pain. Pt state pain starts in center chest, goes into throat and into right ear x 3days

## 2012-12-16 NOTE — ED Notes (Signed)
Family at bedside. 

## 2012-12-16 NOTE — ED Notes (Signed)
Patient transported to X-ray 

## 2012-12-18 ENCOUNTER — Ambulatory Visit: Payer: BC Managed Care – PPO | Admitting: Family Medicine

## 2013-05-10 ENCOUNTER — Emergency Department (HOSPITAL_COMMUNITY)
Admission: EM | Admit: 2013-05-10 | Discharge: 2013-05-10 | Disposition: A | Discharge status: Home / Self Care | Payer: BC Managed Care – PPO | Attending: Emergency Medicine | Admitting: Emergency Medicine

## 2013-05-10 ENCOUNTER — Encounter (HOSPITAL_COMMUNITY): Payer: Self-pay | Admitting: Emergency Medicine

## 2013-05-10 DIAGNOSIS — F329 Major depressive disorder, single episode, unspecified: Secondary | ICD-10-CM | POA: Insufficient documentation

## 2013-05-10 DIAGNOSIS — Z8659 Personal history of other mental and behavioral disorders: Secondary | ICD-10-CM | POA: Insufficient documentation

## 2013-05-10 DIAGNOSIS — F1910 Other psychoactive substance abuse, uncomplicated: Secondary | ICD-10-CM

## 2013-05-10 DIAGNOSIS — F3289 Other specified depressive episodes: Secondary | ICD-10-CM | POA: Insufficient documentation

## 2013-05-10 DIAGNOSIS — F1410 Cocaine abuse, uncomplicated: Secondary | ICD-10-CM

## 2013-05-10 DIAGNOSIS — R45851 Suicidal ideations: Secondary | ICD-10-CM | POA: Insufficient documentation

## 2013-05-10 DIAGNOSIS — Z791 Long term (current) use of non-steroidal anti-inflammatories (NSAID): Secondary | ICD-10-CM | POA: Insufficient documentation

## 2013-05-10 DIAGNOSIS — F1010 Alcohol abuse, uncomplicated: Secondary | ICD-10-CM | POA: Insufficient documentation

## 2013-05-10 DIAGNOSIS — Z79899 Other long term (current) drug therapy: Secondary | ICD-10-CM | POA: Insufficient documentation

## 2013-05-10 DIAGNOSIS — M549 Dorsalgia, unspecified: Secondary | ICD-10-CM | POA: Insufficient documentation

## 2013-05-10 DIAGNOSIS — F17200 Nicotine dependence, unspecified, uncomplicated: Secondary | ICD-10-CM | POA: Insufficient documentation

## 2013-05-10 DIAGNOSIS — G8929 Other chronic pain: Secondary | ICD-10-CM | POA: Insufficient documentation

## 2013-05-10 HISTORY — DX: Dorsalgia, unspecified: M54.9

## 2013-05-10 HISTORY — DX: Major depressive disorder, single episode, unspecified: F32.9

## 2013-05-10 HISTORY — DX: Depression, unspecified: F32.A

## 2013-05-10 HISTORY — DX: Other chronic pain: G89.29

## 2013-05-10 HISTORY — DX: Anxiety disorder, unspecified: F41.9

## 2013-05-10 LAB — COMPREHENSIVE METABOLIC PANEL
ALT: 14 U/L (ref 0–53)
Albumin: 4.3 g/dL (ref 3.5–5.2)
Alkaline Phosphatase: 49 U/L (ref 39–117)
Calcium: 9.3 mg/dL (ref 8.4–10.5)
Potassium: 3.4 mEq/L — ABNORMAL LOW (ref 3.5–5.1)
Sodium: 140 mEq/L (ref 135–145)
Total Protein: 7.9 g/dL (ref 6.0–8.3)

## 2013-05-10 LAB — CBC WITH DIFFERENTIAL/PLATELET
Basophils Absolute: 0 10*3/uL (ref 0.0–0.1)
Basophils Relative: 0 % (ref 0–1)
Eosinophils Absolute: 0 10*3/uL (ref 0.0–0.7)
Eosinophils Relative: 1 % (ref 0–5)
MCH: 31.7 pg (ref 26.0–34.0)
MCHC: 34.2 g/dL (ref 30.0–36.0)
Neutrophils Relative %: 65 % (ref 43–77)
Platelets: 187 10*3/uL (ref 150–400)
RBC: 4.79 MIL/uL (ref 4.22–5.81)
RDW: 12.9 % (ref 11.5–15.5)

## 2013-05-10 LAB — RAPID URINE DRUG SCREEN, HOSP PERFORMED
Amphetamines: NOT DETECTED
Benzodiazepines: NOT DETECTED
Cocaine: POSITIVE — AB
Opiates: NOT DETECTED
Tetrahydrocannabinol: NOT DETECTED

## 2013-05-10 NOTE — ED Notes (Signed)
Report called to psy ed. Patient ambulatory to room and calm and cooperative during trnasfer
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
## 2013-05-10 NOTE — ED Notes (Signed)
Provided pt with ham sandwich and sprite until lunch tray arrives.

## 2013-05-10 NOTE — ED Notes (Signed)
D/C instructions given with understanding stated. Belongings bag x1 returned. Ambulatory without difficulty. Denies pain. Escorted by mental health tech to front of building.

## 2013-05-10 NOTE — ED Notes (Signed)
Pt from home,reports that he has been drinking "a lot". Pt states that since last pm, has consumed approx 1/5 of liquor, 4 beers. Pr denies any other drugs. Pt states that he has back pain and took an unknown amt of ibuprofen. His brother was concerned and called EMS. Pt denies SI and states that he doesn't want to hurt himself and had never intended to. Pt reports that his brother is "nervous" because pt is having a lot of family stressors at this time. Pt states that he is depressed because he misses seeing his children. Pt is calm and cooperative, A&O x4 and in NAD.
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
## 2013-05-10 NOTE — ED Notes (Addendum)
Pt changed into blue scrubs, wanded by security and belongings placed in locker #27 until decided whether to keep or d/c. Belongings consists of shirt, pants, cell phone, wallet, cigarettes, lighter.

## 2013-05-10 NOTE — ED Notes (Signed)
[id number]<JY> Expected date:<BR> Expected time:<BR> Means of arrival:<BR> Comments:<BR> SI, took 10 ibuprofen

## 2013-08-15 ENCOUNTER — Encounter (HOSPITAL_COMMUNITY): Payer: Self-pay | Admitting: *Deleted

## 2013-08-15 ENCOUNTER — Emergency Department (HOSPITAL_COMMUNITY): Payer: BC Managed Care – PPO

## 2013-08-15 ENCOUNTER — Emergency Department (HOSPITAL_COMMUNITY)
Admission: EM | Admit: 2013-08-15 | Discharge: 2013-08-15 | Disposition: A | Discharge status: Home / Self Care | Payer: BC Managed Care – PPO | Attending: Emergency Medicine | Admitting: Emergency Medicine

## 2013-08-15 DIAGNOSIS — Y939 Activity, unspecified: Secondary | ICD-10-CM | POA: Insufficient documentation

## 2013-08-15 DIAGNOSIS — R296 Repeated falls: Secondary | ICD-10-CM | POA: Insufficient documentation

## 2013-08-15 DIAGNOSIS — Y929 Unspecified place or not applicable: Secondary | ICD-10-CM | POA: Insufficient documentation

## 2013-08-15 DIAGNOSIS — Z8659 Personal history of other mental and behavioral disorders: Secondary | ICD-10-CM | POA: Insufficient documentation

## 2013-08-15 DIAGNOSIS — S2231XA Fracture of one rib, right side, initial encounter for closed fracture: Secondary | ICD-10-CM

## 2013-08-15 DIAGNOSIS — S2239XA Fracture of one rib, unspecified side, initial encounter for closed fracture: Secondary | ICD-10-CM | POA: Insufficient documentation

## 2013-08-15 DIAGNOSIS — F17200 Nicotine dependence, unspecified, uncomplicated: Secondary | ICD-10-CM | POA: Insufficient documentation

## 2013-08-15 MED ORDER — HYDROCODONE-ACETAMINOPHEN 5-325 MG PO TABS: 1.0000 | ORAL_TABLET | Freq: Four times a day (QID) | ORAL | Status: DC | PRN

## 2013-08-15 MED ORDER — IBUPROFEN 800 MG PO TABS
800.0000 mg | ORAL_TABLET | Freq: Once | ORAL | Status: AC
  Administered 2013-08-15: 800 mg via ORAL
  Filled 2013-08-15: qty 1

## 2013-08-15 NOTE — ED Notes (Signed)
MD at bedside. 

## 2013-08-15 NOTE — ED Provider Notes (Signed)
CSN: [id number]     Arrival date & time 08/15/13  4098 History   First MD Initiated Contact with Patient 08/15/13 [phone]     Chief Complaint  Patient presents with  . Right rib pain    (Consider location/radiation/quality/duration/timing/severity/associated sxs/prior Treatment) Patient is a 32 y.o. male presenting with chest pain. The history is provided by the patient. No language interpreter was used.  Chest Pain Pain location:  R lateral chest Pain quality: sharp   Pain radiates to:  Does not radiate Pain radiates to the back: no   Pain severity:  Moderate Onset quality:  Sudden Duration:  2 days Timing:  Constant Progression:  Unchanged Chronicity:  New Context: trauma   Context comment:  Fell on edge of couch Relieved by:  Nothing Worsened by:  Coughing, movement and deep breathing Ineffective treatments: ibuprofen. Associated symptoms: no abdominal pain, no back pain, no cough, no diaphoresis, no dizziness, no dysphagia, no fatigue, no fever, no headache, no nausea, no numbness, no shortness of breath, not vomiting and no weakness   Risk factors: male sex and smoking   Risk factors: no high cholesterol, no hypertension, no immobilization and no prior DVT/PE     Past Medical History  Diagnosis Date  . Chronic back pain   . Anxiety and depression    Past Surgical History  Procedure Laterality Date  . Foot surgery      right foot   No family history on file. History  Substance Use Topics  . Smoking status: Current Every Day Smoker -- 0.50 packs/day    Types: Cigarettes  . Smokeless tobacco: Not on file  . Alcohol Use: Yes    Review of Systems  Constitutional: Negative for fever, diaphoresis, activity change, appetite change and fatigue.  HENT: Negative for congestion, facial swelling, rhinorrhea and trouble swallowing.   Eyes: Negative for photophobia and pain.  Respiratory: Negative for cough, chest tightness and shortness of breath.   Cardiovascular: Positive  for chest pain. Negative for leg swelling.  Gastrointestinal: Negative for nausea, vomiting, abdominal pain, diarrhea and constipation.  Endocrine: Negative for polydipsia and polyuria.  Genitourinary: Negative for dysuria, urgency, decreased urine volume and difficulty urinating.  Musculoskeletal: Negative for back pain and gait problem.  Skin: Negative for color change, rash and wound.  Allergic/Immunologic: Negative for immunocompromised state.  Neurological: Negative for dizziness, facial asymmetry, speech difficulty, weakness, numbness and headaches.  Psychiatric/Behavioral: Negative for confusion, decreased concentration and agitation.    Allergies  Review of patient's allergies indicates no known allergies.  Home Medications   Current Outpatient Rx  Name  Route  Sig  Dispense  Refill  . ibuprofen (ADVIL,MOTRIN) 200 MG tablet   Oral   Take 800 mg by mouth every 6 (six) hours as needed for pain.         Marland Kitchen HYDROcodone-acetaminophen (NORCO) 5-325 MG per tablet   Oral   Take 1 tablet by mouth every 6 (six) hours as needed for pain.   20 tablet   0    BP 121/62  Pulse 86  Temp(Src) 97.8 F (36.6 C)  Resp 20  SpO2 98% Physical Exam  Constitutional: He is oriented to person, place, and time. He appears well-developed and well-nourished. No distress.  HENT:  Head: Normocephalic and atraumatic.  Mouth/Throat: No oropharyngeal exudate.  Eyes: Pupils are equal, round, and reactive to light.  Neck: Normal range of motion. Neck supple.  Cardiovascular: Normal rate, regular rhythm and normal heart sounds.  Exam reveals no  gallop and no friction rub.   No murmur heard. Pulmonary/Chest: Effort normal and breath sounds normal. No respiratory distress. He has no wheezes. He has no rales. He exhibits bony tenderness.    Abdominal: Soft. Bowel sounds are normal. He exhibits no distension and no mass. There is no tenderness. There is no rebound and no guarding.  Musculoskeletal:  Normal range of motion. He exhibits no edema and no tenderness.  Neurological: He is alert and oriented to person, place, and time.  Skin: Skin is warm and dry.  Psychiatric: He has a normal mood and affect.    ED Course  Procedures (including critical care time) Labs Review Labs Reviewed - No data to display Imaging Review Dg Chest 2 View  08/15/2013   *RADIOLOGY REPORT*  Clinical Data: Post fall this past Wednesday, now with right lateral rib and thoracic pain, initial encounter.  CHEST - 2 VIEW  Comparison: 12/16/2012  Findings:  Unchanged cardiac silhouette and mediastinal contours. There is a suspected nondisplaced fracture involving the lateral aspect of the right sixth rib.  No associated pneumothorax or pleural effusion. No focal airspace opacities.  The lungs appear mildly hyperexpanded with flattening of the bilateral hemidiaphragms and mild to slightly nodular thickening of the interstitium.  IMPRESSION: 1.  Suspected minimally displaced fracture of the lateral aspect of the right sixth rib without associated pneumothorax or pleural effusion.  2. Mild lung hyperexpansion and bronchitic change.   Original Report Authenticated By: Tacey Ruiz, MD    MDM   1. Right rib fracture, closed, initial encounter    Pt is a 32 y.o. male with Pmhx as above who presents with R lateral chest wall pain since falling on corner of couch 2 days ago.  Pain is pleuritic, reproducible & worse w/ mvmt. Lungs clear.  No recent illness, PERC negative.  XR ordered, shows nondisplaced 6th lateral rib fx w/o underlying lung changes.  Will d/c home w/ instructions for incentive spirometer, norco/ibuprofen for pain.  Return precautions given for new or worsening symptoms including fever, cough, SOB.  1. Right rib fracture, closed, initial encounter         Shanna Cisco, MD 08/15/13 [phone]

## 2013-08-15 NOTE — ED Notes (Signed)
Pt fell on corner of couch hurting right rib cage

## 2015-01-19 ENCOUNTER — Encounter (HOSPITAL_COMMUNITY): Payer: Self-pay | Admitting: *Deleted

## 2015-01-19 ENCOUNTER — Emergency Department (HOSPITAL_COMMUNITY)
Admission: EM | Admit: 2015-01-19 | Discharge: 2015-01-20 | Disposition: A | Discharge status: Home / Self Care | Payer: Self-pay | Attending: Emergency Medicine | Admitting: Emergency Medicine

## 2015-01-19 DIAGNOSIS — X12XXXA Contact with other hot fluids, initial encounter: Secondary | ICD-10-CM | POA: Insufficient documentation

## 2015-01-19 DIAGNOSIS — Z720 Tobacco use: Secondary | ICD-10-CM | POA: Insufficient documentation

## 2015-01-19 DIAGNOSIS — Y990 Civilian activity done for income or pay: Secondary | ICD-10-CM | POA: Insufficient documentation

## 2015-01-19 DIAGNOSIS — G8929 Other chronic pain: Secondary | ICD-10-CM | POA: Insufficient documentation

## 2015-01-19 DIAGNOSIS — T22012A Burn of unspecified degree of left forearm, initial encounter: Secondary | ICD-10-CM | POA: Insufficient documentation

## 2015-01-19 DIAGNOSIS — T300 Burn of unspecified body region, unspecified degree: Secondary | ICD-10-CM

## 2015-01-19 DIAGNOSIS — Y9389 Activity, other specified: Secondary | ICD-10-CM | POA: Insufficient documentation

## 2015-01-19 DIAGNOSIS — Y9289 Other specified places as the place of occurrence of the external cause: Secondary | ICD-10-CM | POA: Insufficient documentation

## 2015-01-19 DIAGNOSIS — Z8659 Personal history of other mental and behavioral disorders: Secondary | ICD-10-CM | POA: Insufficient documentation

## 2015-01-19 MED ORDER — BACITRACIN ZINC 500 UNIT/GM EX OINT
1.0000 "application " | TOPICAL_OINTMENT | Freq: Once | CUTANEOUS | Status: AC
  Administered 2015-01-20: 1 via TOPICAL
  Filled 2015-01-19: qty 0.9

## 2015-01-19 MED ORDER — BACITRACIN 500 UNIT/GM EX OINT: 1.0000 "application " | TOPICAL_OINTMENT | Freq: Two times a day (BID) | CUTANEOUS | Status: DC
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
## 2015-01-19 NOTE — Discharge Instructions (Signed)
Burn Care Your skin is a natural barrier to infection. It is the largest organ of your body. Burns damage this natural protection. To help prevent infection, it is very important to follow your caregiver's instructions in the care of your burn. Burns are classified as:  First degree. There is only redness of the skin (erythema). No scarring is expected.  Second degree. There is blistering of the skin. Scarring may occur with deeper burns.  Third degree. All layers of the skin are injured, and scarring is expected. HOME CARE INSTRUCTIONS   Wash your hands well before changing your bandage.  Change your bandage as often as directed by your caregiver.  Remove the old bandage. If the bandage sticks, you may soak it off with cool, clean water.  Cleanse the burn thoroughly but gently with mild soap and water.  Pat the area dry with a clean, dry cloth.  Apply a thin layer of antibacterial cream to the burn.  Apply a clean bandage as instructed by your caregiver.  Keep the bandage as clean and dry as possible.  Elevate the affected area for the first 24 hours, then as instructed by your caregiver.  Only take over-the-counter or prescription medicines for pain, discomfort, or fever as directed by your caregiver. SEEK IMMEDIATE MEDICAL CARE IF:   You develop excessive pain.  You develop redness, tenderness, swelling, or red streaks near the burn.  The burned area develops yellowish-white fluid (pus) or a bad smell.  You have a fever. MAKE SURE YOU:   Understand these instructions.  Will watch your condition.  Will get help right away if you are not doing well or get worse. Document Released: 10/30/2005 Document Revised: 01/22/2012 Document Reviewed: 03/22/2011 Exit[employer] Patient Information 2015 [employer]. This information is not intended to replace advice given to you by your health care provider. Make sure you discuss any questions you have with your health care  provider.  

## 2015-01-19 NOTE — ED Notes (Signed)
MD at bedside. 
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
## 2016-08-07 ENCOUNTER — Emergency Department (HOSPITAL_COMMUNITY)
Admission: EM | Admit: 2016-08-07 | Discharge: 2016-08-07 | Disposition: A | Discharge status: Home / Self Care | Payer: BLUE CROSS/BLUE SHIELD | Attending: Emergency Medicine | Admitting: Emergency Medicine

## 2016-08-07 ENCOUNTER — Encounter (HOSPITAL_COMMUNITY): Payer: Self-pay

## 2016-08-07 DIAGNOSIS — J029 Acute pharyngitis, unspecified: Secondary | ICD-10-CM | POA: Diagnosis present

## 2016-08-07 DIAGNOSIS — Z791 Long term (current) use of non-steroidal anti-inflammatories (NSAID): Secondary | ICD-10-CM | POA: Diagnosis not present

## 2016-08-07 DIAGNOSIS — F17210 Nicotine dependence, cigarettes, uncomplicated: Secondary | ICD-10-CM | POA: Insufficient documentation

## 2016-08-07 DIAGNOSIS — Z79899 Other long term (current) drug therapy: Secondary | ICD-10-CM | POA: Insufficient documentation

## 2016-08-07 LAB — RAPID STREP SCREEN (MED CTR MEBANE ONLY): Streptococcus, Group A Screen (Direct): NEGATIVE

## 2016-08-07 MED ORDER — ACETAMINOPHEN 325 MG PO TABS
650.0000 mg | ORAL_TABLET | Freq: Once | ORAL | Status: AC | PRN
  Administered 2016-08-07: 650 mg via ORAL
  Filled 2016-08-07: qty 2

## 2016-08-07 NOTE — ED Provider Notes (Signed)
[facility]-EMERGENCY DEPT Provider Note   CSN: [id number] Arrival date & time: 08/07/16  2003     History   Chief Complaint Chief Complaint  Patient presents with  . Sore Throat    HPI Justin Riggs is a 35 y.o. male.  Pt comes in today with c/o of sore throat times 2 days. States low grade fever. Hurts to swallow. Dry cough. People at work have had strep      Past Medical History:  Diagnosis Date  . Anxiety and depression   . Chronic back pain     Patient Active Problem List   Diagnosis Date Noted  . CANNABIS ABUSE 08/04/2010  . SINUSITIS, CHRONIC 08/04/2010  . OTITIS MEDIA, SEROUS, LEFT 05/26/2010  . TOBACCO USER 08/21/2009  . MOOD DISORDER 07/02/2009  . ANXIETY STATE, UNSPECIFIED 07/02/2009    Past Surgical History:  Procedure Laterality Date  . FOOT SURGERY     right foot       Home Medications    Prior to Admission medications   Medication Sig Start Date End Date Taking? Authorizing Provider  HYDROcodone-acetaminophen (NORCO) 5-325 MG per tablet Take 1 tablet by mouth every 6 (six) hours as needed for pain. 08/15/13   Toy CookeyMegan Docherty, MD  ibuprofen (ADVIL,MOTRIN) 200 MG tablet Take 800 mg by mouth every 6 (six) hours as needed for pain.    Historical Provider, MD    Family History History reviewed. No pertinent family history.  Social History Social History  Substance Use Topics  . Smoking status: Current Every Day Smoker    Packs/day: 0.50    Types: Cigarettes  . Smokeless tobacco: Never Used  . Alcohol use Yes     Allergies   Review of patient's allergies indicates no known allergies.   Review of Systems Review of Systems  All other systems reviewed and are negative.    Physical Exam Updated Vital Signs BP 117/78   Pulse 73   Temp 100.3 F (37.9 C) (Oral)   Resp 18   Ht 5\' 7"  (1.702 m)   Wt 72.1 kg   SpO2 99%   BMI 24.90 kg/m   Physical Exam  Constitutional: He appears well-developed and well-nourished.  HENT:  Head:  Normocephalic and atraumatic.  Right Ear: External ear normal.  Left Ear: External ear normal.  Mouth/Throat: Posterior oropharyngeal erythema present.  Neck: Normal range of motion. Neck supple.  Cardiovascular: Normal rate and regular rhythm.   Pulmonary/Chest: Effort normal and breath sounds normal.  Musculoskeletal: Normal range of motion.  Neurological: He is alert.  Skin: Skin is warm and dry.  Nursing note and vitals reviewed.    ED Treatments / Results  Labs (all labs ordered are listed, but only abnormal results are displayed) Labs Reviewed  RAPID STREP SCREEN (NOT AT [facility])    EKG  EKG Interpretation None       Radiology No results found.  Procedures Procedures (including critical care time)  Medications Ordered in ED Medications  acetaminophen (TYLENOL) tablet 650 mg (650 mg Oral Given 08/07/16 2118)     Initial Impression / Assessment and Plan / ED Course  I have reviewed the triage vital signs and the nursing notes.  Pertinent labs & imaging results that were available during my care of the patient were reviewed by me and considered in my medical decision making (see chart for details).  Clinical Course    Strep negative symptoms likely viral. Nothing to be done at this time  Final Clinical Impressions(s) /  ED Diagnoses   Final diagnoses:  None    New Prescriptions New Prescriptions   No medications on file     Teressa Lower, NP 08/07/16 2159    Mancel Bale, MD 08/10/16 2205

## 2016-08-07 NOTE — ED Notes (Signed)
PT DISCHARGED. INSTRUCTIONS GIVEN. AAOX4. PT IN NO APPARENT DISTRESS. THE OPPORTUNITY TO ASK QUESTIONS WAS PROVIDED. 

## 2016-08-07 NOTE — ED Triage Notes (Signed)
PT C/O DRY COUGH X2-3 DAYS. PT STS HIS THROAT FEELS VERY ITCHY.

## 2016-08-09 LAB — CULTURE, GROUP A STREP (THRC)

## 2016-08-10 ENCOUNTER — Telehealth (HOSPITAL_COMMUNITY): Payer: Self-pay

## 2016-08-10 NOTE — Progress Notes (Signed)
ED Antimicrobial Stewardship Positive Culture Follow Up   Justin Riggs is an 35 y.o. male [facility] with a chief complaint of  Chief Complaint  Patient presents with  . Sore Throat    Recent Results (from the past 720 hour(s))  Rapid strep screen     Status: None   Collection Time: 08/07/16  9:00 PM  Result Value Ref Range Status   Streptococcus, Group A Screen (Direct) NEGATIVE NEGATIVE Final    Comment: (NOTE) A Rapid Antigen test may result negative if the antigen level in the sample is below the detection level of this test. The FDA has not cleared this test as a stand-alone test therefore the rapid antigen negative result has reflexed to a Group A Strep culture.   Culture, group A strep     Status: None   Collection Time: 08/07/16  9:00 PM  Result Value Ref Range Status   Specimen Description THROAT  Final   Special Requests NONE Reflexed from [id number]  Final   Culture FEW GROUP A STREP (S.PYOGENES) ISOLATED  Final   Report Status 08/09/2016 FINAL  Final     [x]  Patient discharged originally without antimicrobial agent and treatment is now indicated  New antibiotic prescription: Amoxicillin 500 mg BID for 10 days   ED Provider: Wynetta EmeryNicole Pisciotta  Hillis RangeEmily A Stewart 08/10/2016, 8:35 AM Pharmacy Resident  Phone# [phone]

## 2016-08-10 NOTE — Telephone Encounter (Signed)
Post ED Visit - Positive Culture Follow-up: Successful Patient Follow-Up  Culture assessed and recommendations reviewed by: []  [name], Pharm.D. []  [name], Pharm.D., BCPS []  [name], Pharm.D. []  [name], Pharm.D., BCPS []  [name], VermontPharm.D., BCPS, AAHIVP []  [name], Pharm.D., BCPS, AAHIVP []  [name], Pharm.D. []  [name], [address] BoulevardPharm[address]D. [name]  [name], Pharm.D.  Positive throat culture, Group A Strep  [x]  Patient discharged without antimicrobial prescription and treatment is now indicated []  Organism is resistant to prescribed ED discharge antimicrobial []  Patient with positive blood cultures  Changes discussed with ED provider: N. Pisciotta PA-C New antibiotic prescription "Amoxil 500 mg BID x 10 days" Called to   Contacted patient, date 08/10/2016, time 9:57 LVM requesting callback.  [facility]  Justin Riggs, Justin Riggs 08/10/2016, 9:57 AM

## 2016-08-30 ENCOUNTER — Telehealth (HOSPITAL_BASED_OUTPATIENT_CLINIC_OR_DEPARTMENT_OTHER): Payer: Self-pay | Admitting: Emergency Medicine

## 2016-08-30 NOTE — Telephone Encounter (Signed)
Lost to followup 

## 2016-09-18 ENCOUNTER — Encounter (HOSPITAL_COMMUNITY): Payer: Self-pay | Admitting: Emergency Medicine

## 2016-09-18 ENCOUNTER — Emergency Department (HOSPITAL_COMMUNITY): Payer: BLUE CROSS/BLUE SHIELD

## 2016-09-18 ENCOUNTER — Emergency Department (HOSPITAL_COMMUNITY)
Admission: EM | Admit: 2016-09-18 | Discharge: 2016-09-18 | Disposition: A | Discharge status: Home / Self Care | Payer: BLUE CROSS/BLUE SHIELD | Attending: Emergency Medicine | Admitting: Emergency Medicine

## 2016-09-18 DIAGNOSIS — Y929 Unspecified place or not applicable: Secondary | ICD-10-CM | POA: Insufficient documentation

## 2016-09-18 DIAGNOSIS — F0781 Postconcussional syndrome: Secondary | ICD-10-CM

## 2016-09-18 DIAGNOSIS — Z79899 Other long term (current) drug therapy: Secondary | ICD-10-CM | POA: Diagnosis not present

## 2016-09-18 DIAGNOSIS — G44309 Post-traumatic headache, unspecified, not intractable: Secondary | ICD-10-CM | POA: Insufficient documentation

## 2016-09-18 DIAGNOSIS — F17210 Nicotine dependence, cigarettes, uncomplicated: Secondary | ICD-10-CM | POA: Insufficient documentation

## 2016-09-18 DIAGNOSIS — Y9301 Activity, walking, marching and hiking: Secondary | ICD-10-CM | POA: Diagnosis not present

## 2016-09-18 DIAGNOSIS — S0990XA Unspecified injury of head, initial encounter: Secondary | ICD-10-CM | POA: Diagnosis present

## 2016-09-18 DIAGNOSIS — Y999 Unspecified external cause status: Secondary | ICD-10-CM | POA: Insufficient documentation

## 2016-09-18 DIAGNOSIS — S0003XA Contusion of scalp, initial encounter: Secondary | ICD-10-CM | POA: Insufficient documentation

## 2016-09-18 MED ORDER — KETOROLAC TROMETHAMINE 60 MG/2ML IM SOLN
60.0000 mg | Freq: Once | INTRAMUSCULAR | Status: AC
  Administered 2016-09-18: 60 mg via INTRAMUSCULAR
  Filled 2016-09-18: qty 2

## 2016-09-18 MED ORDER — METOCLOPRAMIDE HCL 10 MG PO TABS
10.0000 mg | ORAL_TABLET | Freq: Once | ORAL | Status: AC
  Administered 2016-09-18: 10 mg via ORAL
  Filled 2016-09-18: qty 1

## 2016-09-18 MED ORDER — METOCLOPRAMIDE HCL 10 MG PO TABS: 10.0000 mg | ORAL_TABLET | Freq: Four times a day (QID) | ORAL | 0 refills | Status: DC | PRN

## 2016-09-18 MED ORDER — IBUPROFEN 600 MG PO TABS: 600.0000 mg | ORAL_TABLET | Freq: Four times a day (QID) | ORAL | 0 refills | Status: DC | PRN
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
## 2016-12-27 IMAGING — CT CT HEAD W/O CM
3 of 4 series · 14 of 47 positions shown, 16 images · non-contrast
Comparison: 08/25/2009

CLINICAL DATA: Head injury.  Chronic back pain.  Smoker.

EXAM:
CT HEAD WITHOUT CONTRAST
TECHNIQUE: Contiguous axial images were obtained from the base of the skull
through the vertex without intravenous contrast.

[Series 2: head w/o · axial · non-contrast · 0.45mm/px · z∈[-137,-22]mm · 8 of 29 slices shown, 10 images]
[im 3/29  brain]
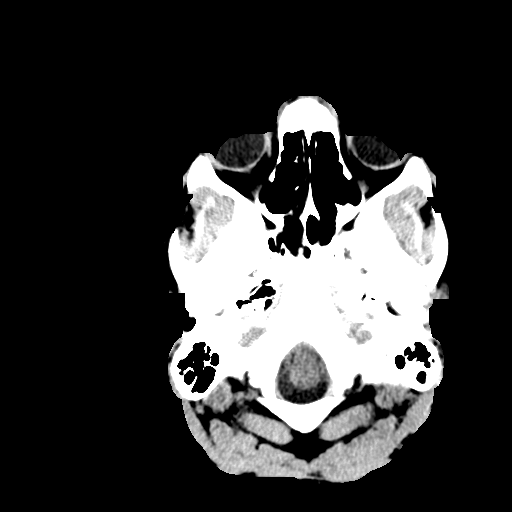
[im 3/29  bone]
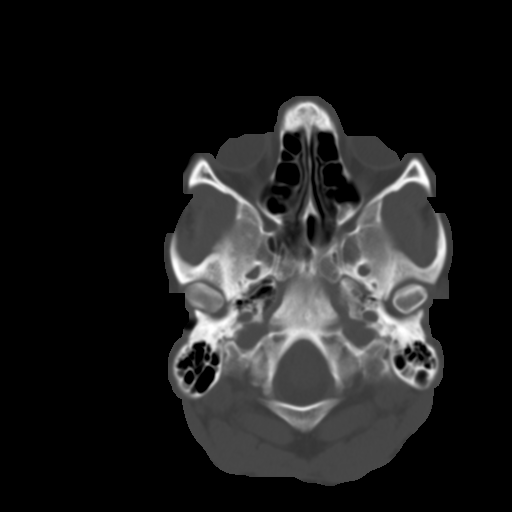
[im 6/29  brain]
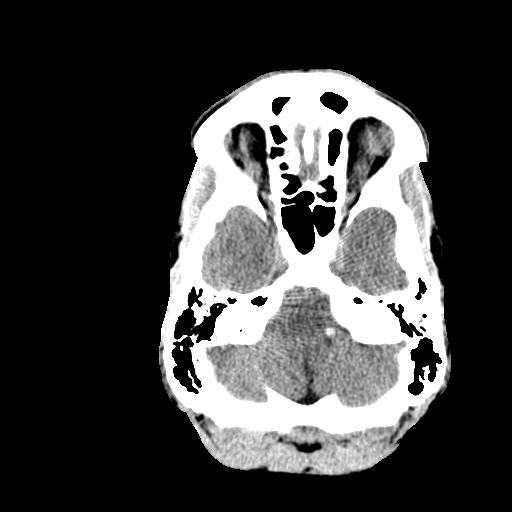
[im 9/29  brain]
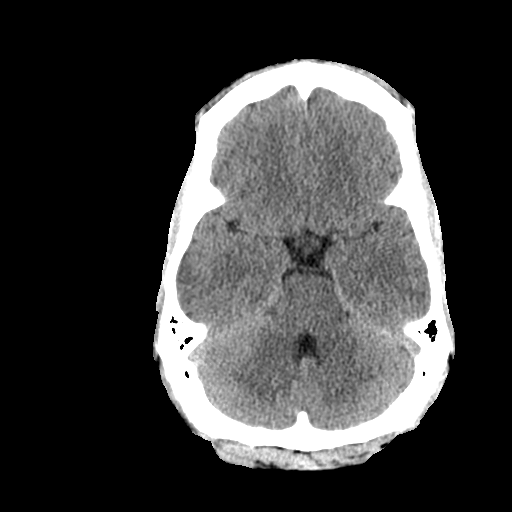
[im 12/29  brain]
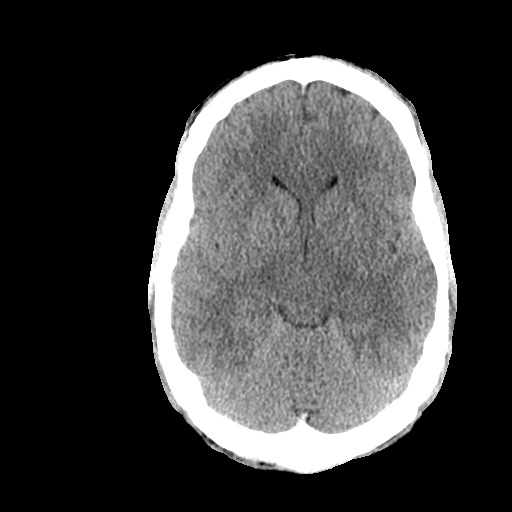
[im 17/29  brain]
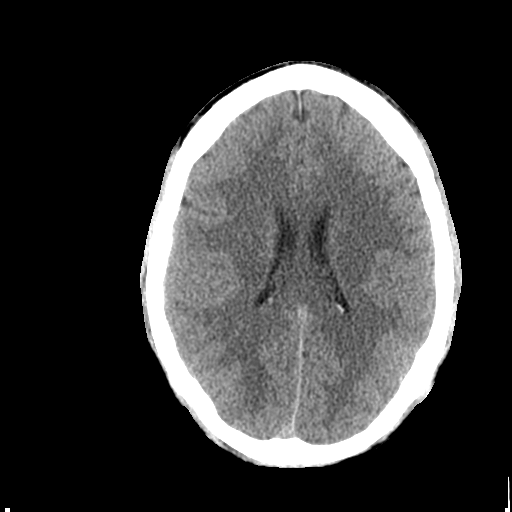
[im 17/29  bone]
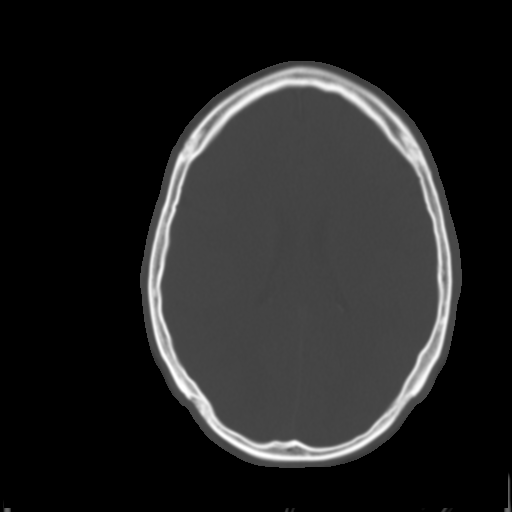
[im 20/29  brain]
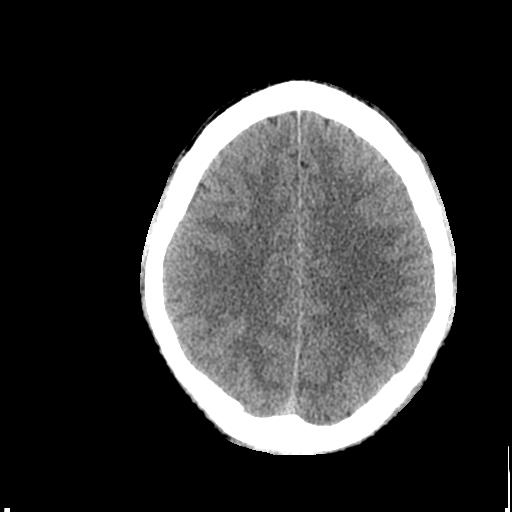
[im 23/29  brain]
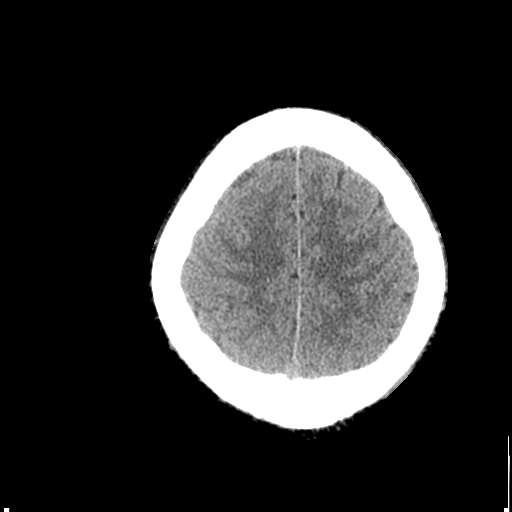
[im 26/29  brain]
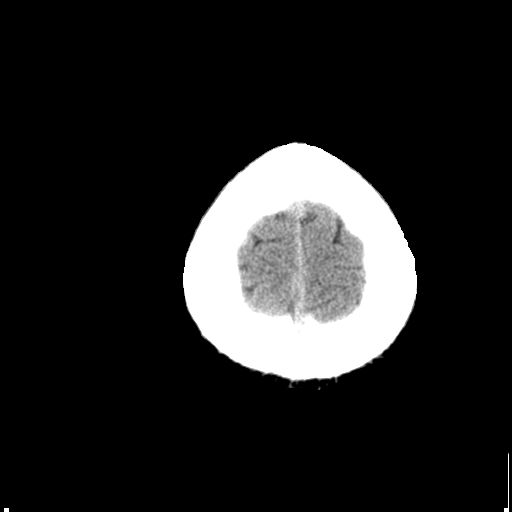

[Series 5: coronal · coronal · 0.32mm/px · 3 of 74 slices shown]
[im 25/74  brain]
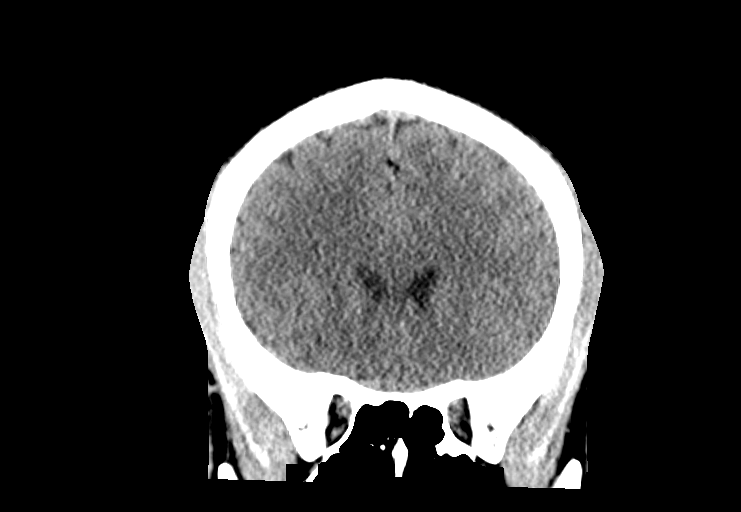
[im 33/74  brain]
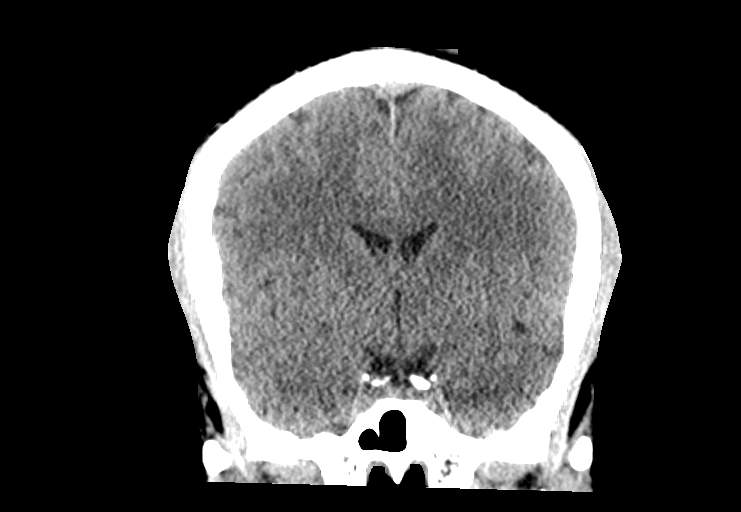
[im 41/74  brain]
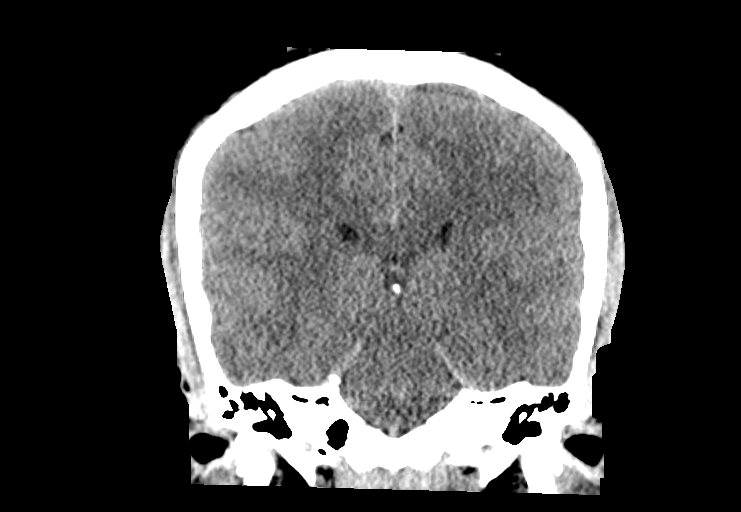

[Series 6: sagittal · sagittal · 0.30mm/px · 3 of 57 slices shown]
[im 19/57  brain]
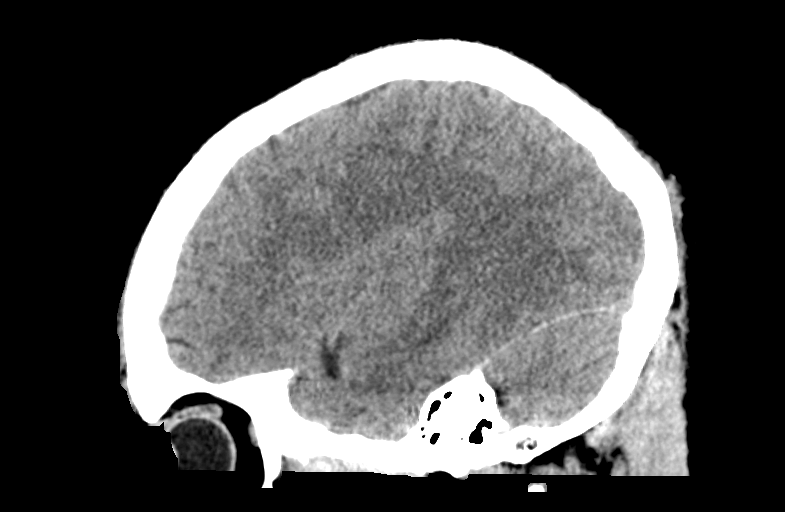
[im 29/57  brain]
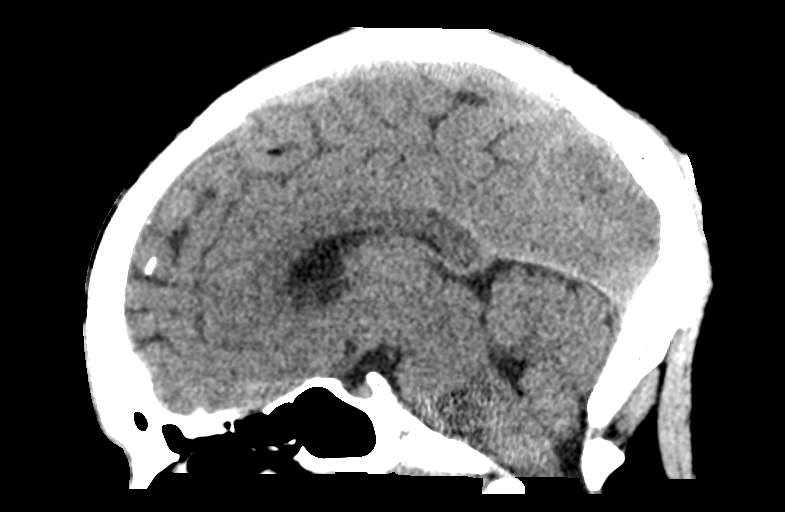
[im 38/57  brain]
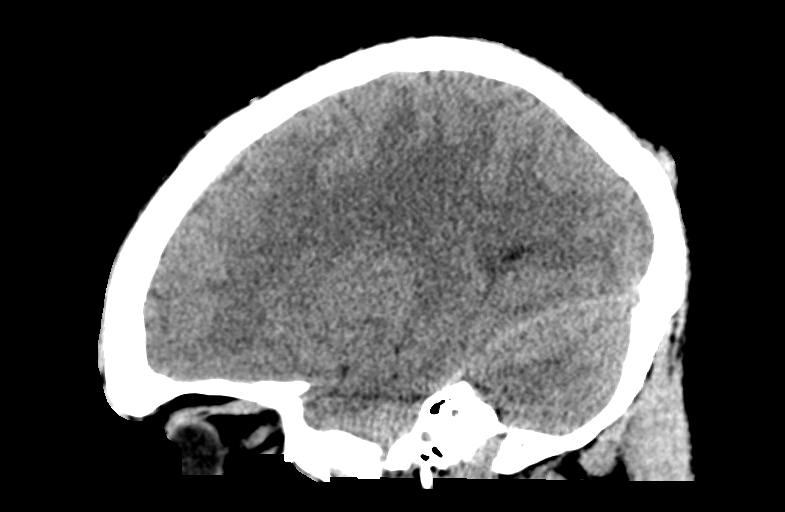

[14 of 47 positions shown; findings below may reference images not displayed]

FINDINGS: Brain: No evidence of acute infarction, hemorrhage, hydrocephalus,
extra-axial collection or mass lesion/mass effect.

Vascular: No hyperdense vessel or unexpected calcification.

Skull: Normal. Negative for fracture or focal lesion.

Sinuses/Orbits: No acute finding.

Other: None.
IMPRESSION: No acute intracranial abnormalities.

## 2019-01-14 ENCOUNTER — Other Ambulatory Visit: Payer: Self-pay

## 2019-01-14 ENCOUNTER — Encounter (HOSPITAL_COMMUNITY): Payer: Self-pay | Admitting: Emergency Medicine

## 2019-01-14 ENCOUNTER — Ambulatory Visit (HOSPITAL_COMMUNITY)
Admission: EM | Admit: 2019-01-14 | Discharge: 2019-01-14 | Disposition: A | Discharge status: Home / Self Care | Payer: BLUE CROSS/BLUE SHIELD | Attending: Family Medicine | Admitting: Family Medicine

## 2019-01-14 DIAGNOSIS — K0889 Other specified disorders of teeth and supporting structures: Secondary | ICD-10-CM | POA: Diagnosis not present

## 2019-01-14 LAB — POCT RAPID STREP A: Streptococcus, Group A Screen (Direct): NEGATIVE

## 2019-01-14 MED ORDER — IBUPROFEN 800 MG PO TABS: 800.0000 mg | ORAL_TABLET | Freq: Three times a day (TID) | ORAL | 0 refills | Status: DC

## 2019-01-14 MED ORDER — AMOXICILLIN 875 MG PO TABS: 875.0000 mg | ORAL_TABLET | Freq: Two times a day (BID) | ORAL | 1 refills | Status: DC

## 2019-01-14 NOTE — ED Provider Notes (Signed)
[facility]    CSN: [id number] Arrival date & time: 01/14/19  1439     History   Chief Complaint Chief Complaint  Patient presents with  . Generalized Body Aches  . Fever  . Sore Throat    HPI Justin Riggs is a 38 y.o. male.   HPI  he is here for an upper respiratory infection.  He states that he had a sore throat for 2 weeks.  He also has some fever and cold symptoms.  He states he also has dental problems with 2 teeth on the lower right that are broken off, infected, and draining.  He thinks this may have been causing some of his sore throat.  States there is strep at his workplace.  No body aches.  Temperature to 101 at home.  Past Medical History:  Diagnosis Date  . Anxiety and depression   . Chronic back pain     Patient Active Problem List   Diagnosis Date Noted  . CANNABIS ABUSE 08/04/2010  . SINUSITIS, CHRONIC 08/04/2010  . OTITIS MEDIA, SEROUS, LEFT 05/26/2010  . TOBACCO USER 08/21/2009  . MOOD DISORDER 07/02/2009  . ANXIETY STATE, UNSPECIFIED 07/02/2009    Past Surgical History:  Procedure Laterality Date  . FOOT SURGERY     right foot       Home Medications    Prior to Admission medications   Medication Sig Start Date End Date Taking? Authorizing Provider  amoxicillin (AMOXIL) 875 MG tablet Take 1 tablet (875 mg total) by mouth 2 (two) times daily. 01/14/19   Eustace Moore, MD  ibuprofen (ADVIL,MOTRIN) 800 MG tablet Take 1 tablet (800 mg total) by mouth 3 (three) times daily. 01/14/19   Eustace Moore, MD  metoCLOPramide (REGLAN) 10 MG tablet Take 1 tablet (10 mg total) by mouth every 6 (six) hours as needed for nausea (headache). 09/18/16   Loren Racer, MD    Family History History reviewed. No pertinent family history.  Social History Social History   Tobacco Use  . Smoking status: Current Every Day Smoker    Packs/day: 0.25    Types: Cigarettes  . Smokeless tobacco: Never Used  Substance Use Topics  . Alcohol  use: Yes  . Drug use: Yes    Types: Marijuana     Allergies   Patient has no known allergies.   Review of Systems Review of Systems  Constitutional: Positive for fatigue and fever. Negative for chills.  HENT: Positive for dental problem and sore throat. Negative for ear pain.   Eyes: Negative for pain and visual disturbance.  Respiratory: Negative for cough and shortness of breath.   Cardiovascular: Negative for chest pain and palpitations.  Gastrointestinal: Negative for abdominal pain and vomiting.  Genitourinary: Negative for dysuria and hematuria.  Musculoskeletal: Negative for arthralgias and back pain.  Skin: Negative for color change and rash.  Neurological: Negative for seizures and syncope.  All other systems reviewed and are negative.    Physical Exam Triage Vital Signs ED Triage Vitals  Enc Vitals Group     BP 01/14/19 1510 128/88     Pulse Rate 01/14/19 1510 76     Resp --      Temp 01/14/19 1510 98.7 F (37.1 C)     Temp Source 01/14/19 1510 Oral     SpO2 01/14/19 1510 98 %     Weight --      Height --      Head Circumference --  Peak Flow --      Pain Score 01/14/19 1511 9     Pain Loc --      Pain Edu? --      Excl. in GC? --    No data found.  Updated Vital Signs BP 128/88 (BP Location: Right Arm)   Pulse 76   Temp 98.7 F (37.1 C) (Oral)   SpO2 98%      Physical Exam Constitutional:      General: He is not in acute distress.    Appearance: He is well-developed.  HENT:     Head: Normocephalic and atraumatic.     Right Ear: Tympanic membrane and ear canal normal.     Left Ear: Tympanic membrane and ear canal normal.     Nose: No congestion or rhinorrhea.     Mouth/Throat:     Pharynx: Posterior oropharyngeal erythema present.     Tonsils: No tonsillar exudate. Swelling: 2+ on the right. 2+ on the left.   Eyes:     Conjunctiva/sclera: Conjunctivae normal.     Pupils: Pupils are equal, round, and reactive to light.  Neck:      Musculoskeletal: Normal range of motion.  Cardiovascular:     Rate and Rhythm: Normal rate.  Pulmonary:     Effort: Pulmonary effort is normal. No respiratory distress.  Abdominal:     General: There is no distension.     Palpations: Abdomen is soft.  Musculoskeletal: Normal range of motion.  Lymphadenopathy:     Cervical: Cervical adenopathy present.     Left cervical: Superficial cervical adenopathy present.  Skin:    General: Skin is warm and dry.  Neurological:     Mental Status: He is alert.      UC Treatments / Results  Labs (all labs ordered are listed, but only abnormal results are displayed) Labs Reviewed  CULTURE, GROUP A STREP [facility])  POCT RAPID STREP A    EKG None  Radiology No results found.  Procedures Procedures (including critical care time)  Medications Ordered in UC Medications - No data to display  Initial Impression / Assessment and Plan / UC Course  I have reviewed the triage vital signs and the nursing notes.  Pertinent labs & imaging results that were available during my care of the patient were reviewed by me and considered in my medical decision making (see chart for details).     He has a sore throat plus dental infection.  He has a fever.  I am going to treat him with 10 days of antibiotics even though the strep test is negative.  He needs follow-up with a dentist. Final Clinical Impressions(s) / UC Diagnoses   Final diagnoses:  Pain, dental     Discharge Instructions     Strep test is negative. Amoxicillin 2 times a day for 10 days This is for your dental infection Ibuprofen 3 times a day with food This is for pain Follow-up with a dentist    ED Prescriptions    Medication Sig Dispense Auth. Provider   ibuprofen (ADVIL,MOTRIN) 800 MG tablet Take 1 tablet (800 mg total) by mouth 3 (three) times daily. 21 tablet Eustace Moore, MD   amoxicillin (AMOXIL) 875 MG tablet Take 1 tablet (875 mg total) by mouth 2 (two) times  daily. 14 tablet Eustace Moore, MD     Controlled Substance Prescriptions [place] Controlled Substance Registry consulted? Not Applicable   Eustace Moore, MD 01/14/19 2208

## 2019-01-14 NOTE — ED Triage Notes (Signed)
Pt reports sore throat x2 weeks, possibly related to dental issues.  He has having left lower dental pain and the throat hurts in the same area.  He states that he has had body aches and a fever of 101 for the last two days.

## 2019-01-14 NOTE — Discharge Instructions (Signed)
Strep test is negative. Amoxicillin 2 times a day for 10 days This is for your dental infection Ibuprofen 3 times a day with food This is for pain Follow-up with a dentist

## 2019-01-16 LAB — CULTURE, GROUP A STREP (THRC)

## 2021-11-15 DIAGNOSIS — M9902 Segmental and somatic dysfunction of thoracic region: Secondary | ICD-10-CM | POA: Diagnosis not present

## 2021-11-15 DIAGNOSIS — M25511 Pain in right shoulder: Secondary | ICD-10-CM | POA: Diagnosis not present

## 2021-11-15 DIAGNOSIS — M9905 Segmental and somatic dysfunction of pelvic region: Secondary | ICD-10-CM | POA: Diagnosis not present

## 2021-11-15 DIAGNOSIS — M7541 Impingement syndrome of right shoulder: Secondary | ICD-10-CM | POA: Diagnosis not present

## 2021-11-15 DIAGNOSIS — M545 Low back pain, unspecified: Secondary | ICD-10-CM | POA: Diagnosis not present

## 2021-11-15 DIAGNOSIS — M5386 Other specified dorsopathies, lumbar region: Secondary | ICD-10-CM | POA: Diagnosis not present

## 2021-11-22 DIAGNOSIS — M545 Low back pain, unspecified: Secondary | ICD-10-CM | POA: Diagnosis not present

## 2021-11-22 DIAGNOSIS — M9902 Segmental and somatic dysfunction of thoracic region: Secondary | ICD-10-CM | POA: Diagnosis not present

## 2021-11-22 DIAGNOSIS — M25511 Pain in right shoulder: Secondary | ICD-10-CM | POA: Diagnosis not present

## 2021-11-22 DIAGNOSIS — M9905 Segmental and somatic dysfunction of pelvic region: Secondary | ICD-10-CM | POA: Diagnosis not present

## 2021-11-22 DIAGNOSIS — M7541 Impingement syndrome of right shoulder: Secondary | ICD-10-CM | POA: Diagnosis not present

## 2021-11-22 DIAGNOSIS — M5386 Other specified dorsopathies, lumbar region: Secondary | ICD-10-CM | POA: Diagnosis not present

## 2021-12-21 DIAGNOSIS — R419 Unspecified symptoms and signs involving cognitive functions and awareness: Secondary | ICD-10-CM | POA: Diagnosis not present

## 2021-12-21 DIAGNOSIS — F1491 Cocaine use, unspecified, in remission: Secondary | ICD-10-CM | POA: Diagnosis not present

## 2021-12-21 DIAGNOSIS — F32A Depression, unspecified: Secondary | ICD-10-CM | POA: Diagnosis not present

## 2021-12-21 DIAGNOSIS — Z818 Family history of other mental and behavioral disorders: Secondary | ICD-10-CM | POA: Diagnosis not present

## 2021-12-21 DIAGNOSIS — F4322 Adjustment disorder with anxiety: Secondary | ICD-10-CM | POA: Diagnosis not present

## 2021-12-21 DIAGNOSIS — Z630 Problems in relationship with spouse or partner: Secondary | ICD-10-CM | POA: Diagnosis not present

## 2022-01-30 DIAGNOSIS — F4389 Other reactions to severe stress: Secondary | ICD-10-CM | POA: Diagnosis not present

## 2022-02-09 DIAGNOSIS — F4389 Other reactions to severe stress: Secondary | ICD-10-CM | POA: Diagnosis not present

## 2022-02-16 DIAGNOSIS — F4389 Other reactions to severe stress: Secondary | ICD-10-CM | POA: Diagnosis not present

## 2022-02-23 DIAGNOSIS — F4389 Other reactions to severe stress: Secondary | ICD-10-CM | POA: Diagnosis not present

## 2022-03-02 DIAGNOSIS — F4389 Other reactions to severe stress: Secondary | ICD-10-CM | POA: Diagnosis not present

## 2022-03-09 DIAGNOSIS — F4389 Other reactions to severe stress: Secondary | ICD-10-CM | POA: Diagnosis not present

## 2022-03-12 DIAGNOSIS — F339 Major depressive disorder, recurrent, unspecified: Secondary | ICD-10-CM | POA: Diagnosis not present

## 2022-03-12 DIAGNOSIS — F330 Major depressive disorder, recurrent, mild: Secondary | ICD-10-CM | POA: Diagnosis not present

## 2022-03-12 DIAGNOSIS — Z9151 Personal history of suicidal behavior: Secondary | ICD-10-CM | POA: Diagnosis not present

## 2022-03-12 DIAGNOSIS — F1010 Alcohol abuse, uncomplicated: Secondary | ICD-10-CM | POA: Diagnosis not present

## 2022-03-12 DIAGNOSIS — Z20822 Contact with and (suspected) exposure to covid-19: Secondary | ICD-10-CM | POA: Diagnosis not present

## 2022-03-12 DIAGNOSIS — R451 Restlessness and agitation: Secondary | ICD-10-CM | POA: Diagnosis not present

## 2022-03-12 DIAGNOSIS — F10129 Alcohol abuse with intoxication, unspecified: Secondary | ICD-10-CM | POA: Diagnosis not present

## 2022-03-12 DIAGNOSIS — Y905 Blood alcohol level of 100-119 mg/100 ml: Secondary | ICD-10-CM | POA: Diagnosis not present

## 2022-03-12 DIAGNOSIS — Z818 Family history of other mental and behavioral disorders: Secondary | ICD-10-CM | POA: Diagnosis not present

## 2022-03-12 DIAGNOSIS — R45851 Suicidal ideations: Secondary | ICD-10-CM | POA: Diagnosis not present

## 2022-03-13 DIAGNOSIS — F1010 Alcohol abuse, uncomplicated: Secondary | ICD-10-CM | POA: Diagnosis not present

## 2022-03-13 DIAGNOSIS — F330 Major depressive disorder, recurrent, mild: Secondary | ICD-10-CM | POA: Diagnosis not present

## 2022-03-16 DIAGNOSIS — F4389 Other reactions to severe stress: Secondary | ICD-10-CM | POA: Diagnosis not present

## 2022-03-23 DIAGNOSIS — F4389 Other reactions to severe stress: Secondary | ICD-10-CM | POA: Diagnosis not present

## 2022-03-30 DIAGNOSIS — F4389 Other reactions to severe stress: Secondary | ICD-10-CM | POA: Diagnosis not present

## 2022-04-06 DIAGNOSIS — F4389 Other reactions to severe stress: Secondary | ICD-10-CM | POA: Diagnosis not present

## 2022-04-13 DIAGNOSIS — F4389 Other reactions to severe stress: Secondary | ICD-10-CM | POA: Diagnosis not present

## 2022-04-20 DIAGNOSIS — F4389 Other reactions to severe stress: Secondary | ICD-10-CM | POA: Diagnosis not present

## 2022-04-21 ENCOUNTER — Other Ambulatory Visit (HOSPITAL_COMMUNITY)
Admission: EM | Admit: 2022-04-21 | Discharge: 2022-04-22 | Disposition: A | Discharge status: Home / Self Care | Payer: 59 | Attending: Behavioral Health | Admitting: Behavioral Health

## 2022-04-21 DIAGNOSIS — F1490 Cocaine use, unspecified, uncomplicated: Secondary | ICD-10-CM | POA: Insufficient documentation

## 2022-04-21 DIAGNOSIS — Z20822 Contact with and (suspected) exposure to covid-19: Secondary | ICD-10-CM | POA: Insufficient documentation

## 2022-04-21 DIAGNOSIS — F1010 Alcohol abuse, uncomplicated: Secondary | ICD-10-CM | POA: Insufficient documentation

## 2022-04-21 DIAGNOSIS — F1994 Other psychoactive substance use, unspecified with psychoactive substance-induced mood disorder: Secondary | ICD-10-CM | POA: Diagnosis not present

## 2022-04-21 LAB — POCT URINE DRUG SCREEN - MANUAL ENTRY (I-SCREEN)
POC Amphetamine UR: NOT DETECTED
POC Buprenorphine (BUP): NOT DETECTED
POC Cocaine UR: POSITIVE — AB
POC Marijuana UR: POSITIVE — AB
POC Methadone UR: NOT DETECTED
POC Methamphetamine UR: NOT DETECTED
POC Morphine: NOT DETECTED
POC Oxazepam (BZO): NOT DETECTED
POC Oxycodone UR: NOT DETECTED
POC Secobarbital (BAR): NOT DETECTED

## 2022-04-21 LAB — HIV ANTIBODY (ROUTINE TESTING W REFLEX): HIV Screen 4th Generation wRfx: NONREACTIVE

## 2022-04-21 LAB — HEMOGLOBIN A1C
Hgb A1c MFr Bld: 5.7 % — ABNORMAL HIGH (ref 4.8–5.6)
Mean Plasma Glucose: 116.89 mg/dL

## 2022-04-21 LAB — LIPID PANEL
Cholesterol: 196 mg/dL (ref 0–200)
HDL: 45 mg/dL (ref >40)
LDL Cholesterol: 133 mg/dL — ABNORMAL HIGH (ref 0–99)
Total CHOL/HDL Ratio: 4.4 RATIO
Triglycerides: 92 mg/dL (ref <150)
VLDL: 18 mg/dL (ref 0–40)

## 2022-04-21 LAB — COMPREHENSIVE METABOLIC PANEL
ALT: 29 U/L (ref 0–44)
AST: 23 U/L (ref 15–41)
Albumin: 4.5 g/dL (ref 3.5–5.0)
Alkaline Phosphatase: 46 U/L (ref 38–126)
Anion gap: 10 (ref 5–15)
BUN: 8 mg/dL (ref 6–20)
CO2: 28 mmol/L (ref 22–32)
Calcium: 9.4 mg/dL (ref 8.9–10.3)
Chloride: 101 mmol/L (ref 98–111)
Creatinine, Ser: 1.09 mg/dL (ref 0.61–1.24)
GFR, Estimated: >60 mL/min (ref >60)
Glucose, Bld: 81 mg/dL (ref 70–99)
Potassium: 3.4 mmol/L — ABNORMAL LOW (ref 3.5–5.1)
Sodium: 139 mmol/L (ref 135–145)
Total Bilirubin: 0.7 mg/dL (ref 0.3–1.2)
Total Protein: 7.7 g/dL (ref 6.5–8.1)

## 2022-04-21 LAB — CBC WITH DIFFERENTIAL/PLATELET
Abs Immature Granulocytes: 0.01 10*3/uL (ref 0.00–0.07)
Basophils Absolute: 0 10*3/uL (ref 0.0–0.1)
Basophils Relative: 0 %
Eosinophils Absolute: 0.1 10*3/uL (ref 0.0–0.5)
Eosinophils Relative: 1 %
HCT: 45.3 % (ref 39.0–52.0)
Hemoglobin: 15.1 g/dL (ref 13.0–17.0)
Immature Granulocytes: 0 %
Lymphocytes Relative: 26 %
Lymphs Abs: 2.2 10*3/uL (ref 0.7–4.0)
MCH: 31.5 pg (ref 26.0–34.0)
MCHC: 33.3 g/dL (ref 30.0–36.0)
MCV: 94.6 fL (ref 80.0–100.0)
Monocytes Absolute: 0.7 10*3/uL (ref 0.1–1.0)
Monocytes Relative: 8 %
Neutro Abs: 5.5 10*3/uL (ref 1.7–7.7)
Neutrophils Relative %: 65 %
Platelets: 189 10*3/uL (ref 150–400)
RBC: 4.79 MIL/uL (ref 4.22–5.81)
RDW: 13.6 % (ref 11.5–15.5)
WBC: 8.5 10*3/uL (ref 4.0–10.5)
nRBC: 0 % (ref 0.0–0.2)

## 2022-04-21 LAB — RESP PANEL BY RT-PCR (FLU A&B, COVID) ARPGX2
Influenza A by PCR: NEGATIVE
Influenza B by PCR: NEGATIVE
SARS Coronavirus 2 by RT PCR: NEGATIVE

## 2022-04-21 LAB — POC SARS CORONAVIRUS 2 AG -  ED: SARS Coronavirus 2 Ag: NEGATIVE

## 2022-04-21 LAB — TSH: TSH: 1.92 u[IU]/mL (ref 0.350–4.500)

## 2022-04-21 LAB — ETHANOL: Alcohol, Ethyl (B): <10 mg/dL (ref <10)

## 2022-04-21 MED ORDER — ONDANSETRON 4 MG PO TBDP: 4.0000 mg | ORAL_TABLET | Freq: Four times a day (QID) | ORAL | Status: DC | PRN

## 2022-04-21 MED ORDER — HYDROXYZINE HCL 25 MG PO TABS
25.0000 mg | ORAL_TABLET | Freq: Three times a day (TID) | ORAL | Status: DC | PRN
  Administered 2022-04-21 – 2022-04-22 (×2): 25 mg via ORAL
  Filled 2022-04-21 (×2): qty 1

## 2022-04-21 MED ORDER — ALUM & MAG HYDROXIDE-SIMETH 200-200-20 MG/5ML PO SUSP: 30.0000 mL | ORAL | Status: DC | PRN

## 2022-04-21 MED ORDER — LOPERAMIDE HCL 2 MG PO CAPS: 2.0000 mg | ORAL_CAPSULE | ORAL | Status: DC | PRN

## 2022-04-21 MED ORDER — LORAZEPAM 1 MG PO TABS: 1.0000 mg | ORAL_TABLET | Freq: Four times a day (QID) | ORAL | Status: DC | PRN

## 2022-04-21 MED ORDER — THIAMINE HCL 100 MG PO TABS
100.0000 mg | ORAL_TABLET | Freq: Every day | ORAL | Status: DC
  Administered 2022-04-22: 100 mg via ORAL
  Filled 2022-04-21: qty 1

## 2022-04-21 MED ORDER — ACETAMINOPHEN 325 MG PO TABS: 650.0000 mg | ORAL_TABLET | Freq: Four times a day (QID) | ORAL | Status: DC | PRN

## 2022-04-21 MED ORDER — ADULT MULTIVITAMIN W/MINERALS CH
1.0000 | ORAL_TABLET | Freq: Every day | ORAL | Status: DC
  Administered 2022-04-21 – 2022-04-22 (×2): 1 via ORAL
  Filled 2022-04-21 (×2): qty 1

## 2022-04-21 MED ORDER — MAGNESIUM HYDROXIDE 400 MG/5ML PO SUSP: 30.0000 mL | Freq: Every day | ORAL | Status: DC | PRN

## 2022-04-21 MED ORDER — TRAZODONE HCL 50 MG PO TABS: 50.0000 mg | ORAL_TABLET | Freq: Every evening | ORAL | Status: DC | PRN

## 2022-04-21 MED ORDER — THIAMINE HCL 100 MG/ML IJ SOLN
100.0000 mg | Freq: Once | INTRAMUSCULAR | Status: AC
  Administered 2022-04-21: 100 mg via INTRAMUSCULAR
  Filled 2022-04-21: qty 2
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
## 2022-04-21 NOTE — ED Notes (Signed)
Called [facility] to check on PCR due to it not being resulted in system at this time. Lab is checking for the specimen and will call me back

## 2022-04-21 NOTE — ED Notes (Signed)
Called lab to confirm they received PCR COVID swab. Tech states swab received and processing.

## 2022-04-21 NOTE — Progress Notes (Addendum)
Received Justin Riggs in the assessment room earlier. He was cooperative with the admission process. He was oriented to his new environment and received nourishments. Afterwards he drifted off to sleep in his chair bed. Justin Riggs denied all of the psychiatric symptoms at the present time.

## 2022-04-21 NOTE — Progress Notes (Signed)
   04/21/22 1231  [facility] Triage Screening (Walk-ins at [facility] only)  What Is the Reason for Your Visit/Call Today? Justin Riggs 40 year old present to [facility] accompanied by his brother Sheilah Mins  [phone]) with depressive symptoms triggered by relationship complications, homelessness, family and substance abuse. Substance of choice cocaine, alcohol, and THC last use 04/21/2022. Patient reports he's self medicated to deal with his traumatic events from the past. Hx of SI intent 14 years ago via overdose. Denied homicidal ideations and denied auditory/visual. Patient reports he wants to stay sober until he's admitted into [facility] Sunday.  How Long Has This Been Causing You Problems? 1 wk - 1 month  Have You Recently Had Any Thoughts About Hurting Yourself? No  Are You Planning to Commit Suicide/Harm Yourself At This time? No  Have you Recently Had Thoughts About Hurting Someone Karolee Ohs? No  Are You Planning To Harm Someone At This Time? No  Are you currently experiencing any auditory, visual or other hallucinations? No  Have You Used Any Alcohol or Drugs in the Past 24 Hours? Yes  How long ago did you use Drugs or Alcohol? cocaine, THC, alcohol  What Did You Use and How Much? cocaine, THC, alcohol unknown  Do you have any current medical co-morbidities that require immediate attention? No  Clinician description of patient physical appearance/behavior: Patient dressed appropriate for the weather  What Do You Feel Would Help You the Most Today? Medication(s);Stress Management  If access to [facility] was not available, would you have sought care in the Emergency Department? No  Determination of Need Routine (7 days)  Options For Referral Medication Management;Inpatient Hospitalization

## 2022-04-21 NOTE — ED Provider Notes (Signed)
Facility Based Crisis Admission H&P  Date: 04/21/22 Patient Name: Justin Riggs MRN: [id number] Chief Complaint:  Chief Complaint  Patient presents with   Depression    41 year old present to [facility] accompanied by his brother Justin Riggs  [phone]) with depressive symptoms triggered by relationship complications, homelessness, family and substance abuse. Substance of choice cocaine, alcohol, and THC last use 04/21/2022. Patient reports he's self medicated to deal with his traumatic events from the past. Hx of SI intent 14 years ago via overdose. Denied homicidal ideations and denied auditory/visual.       Diagnoses:  Final diagnoses:  Substance induced mood disorder ([place])  Cocaine use  Alcohol abuse    HPI: Justin Riggs is a 41 year old male patient who presents to the [facility] behavioral health urgent care voluntarily as a walk-in for an evaluation.  Patient states that his brother brought him here for an evaluation for suicidal thoughts, drug use, and relationship issues. He states that he did not know where else to start. He endorses passive suicidal ideations that started yesterday. When asked to describe the suicidal thoughts, he states, "I just know I am tired." He is unable to identity and suicide plan and denies intent. He states that is all he knows is suicidal thoughts when things get hard. He states that he watched his grandmother and mother attempt suicide in the past. He reports one past suicide attempt when he was "really young" by taking pills. He denies self injurious behaviors.   He states that he has been using drugs and alcohol heavily since the pandemic. He states that before the pandemic he was using drugs to party. He reports drinking a 1/2 of bottle of liquor 3x per week. Last drink was yesterday. He reports drinking alcohol since age 19. He denies hx of alcohol withdrawal symptoms, alcohol withdrawal seizures or DT's. He currently denies alcohol  withdrawal symptoms. He reports using cocaine 2 times per week, a half or a gram on average. He reports last using cocaine last night, a gram. He reports using cocaine since age 41. He reports smoking marijuana but states that he hasn't used in a week. He denies a past history of substance use treatment. He states that [facility].   When screened for depressive symptoms. He states that he broke up with his girlfriend yesterday. He states that his girlfriend recently cheated on him. He states that his 57 year old daughter has been acting out. He states that he's tired of being good to people but can't be a bad person either. He states that the pandemic really changed his life. He reports having a hard time dealing with lots of deaths over the past 5 years. He states that he is usually happy until he's not. Patient had a hard time answering depression screening questions and stated current stressors. He states that he started seeing a psychiatrist and therapist online in January 2023. He states that is was dx with ADHD and depression. He states that he was prescribed adderrall in January but stopped the medication because he was using cocaine. He denies a current and past medical hx. He denies taking prescribed medications at this time.   PHQ 2-9:   reassess on 04/22/22.   Total Time spent with patient: 30 minutes  Musculoskeletal  Strength & Muscle Tone: within normal limits Gait & Station: normal Patient leans: N/A  Psychiatric Specialty Exam  Presentation General Appearance:  Appropriate for Environment  Eye Contact:Fair  Speech:Clear and Coherent  Speech Volume:Decreased  Handedness:No data recorded  Mood and Affect  Mood:Dysphoric  Affect:Congruent   Thought Process  Thought Processes:Coherent; Goal Directed  Descriptions of Associations:Intact  Orientation:Full (Time, Place and Person)  Thought Content:Logical     Hallucinations:Hallucinations: None  Ideas of Reference:None  Suicidal Thoughts:Suicidal Thoughts: Yes, Passive SI Passive Intent and/or Plan: Without Intent; Without Plan  Homicidal Thoughts:Homicidal Thoughts: No   Sensorium  Memory:Immediate Fair; Recent Fair; Remote Fair  Judgment:Intact  Insight:Present   Executive Functions  Concentration:Fair  Attention Span:Fair  [place]   Psychomotor Activity  Psychomotor Activity:Psychomotor Activity: Normal   Assets  Assets:No data recorded  Sleep  Sleep:Sleep: Poor   Nutritional Assessment (For [facility]BS and [facility] admissions only) Has the patient had a weight loss or gain of 10 pounds or more in the last 3 months?: No Has the patient had a decrease in food intake/or appetite?: No Does the patient have dental problems?: No Does the patient have eating habits or behaviors that may be indicators of an eating disorder including binging or inducing vomiting?: No Has the patient recently lost weight without trying?: 0 Has the patient been eating poorly because of a decreased appetite?: 0 Malnutrition Screening Tool Score: 0    Physical Exam HENT:     Head: Normocephalic.     Nose: Nose normal.  Eyes:     Conjunctiva/sclera: Conjunctivae normal.  Cardiovascular:     Rate and Rhythm: Normal rate.     Comments: Patient is mildly hypertensive. He is asymptomatic. Denies hx of HTN Pulmonary:     Effort: Pulmonary effort is normal.  Musculoskeletal:        General: Normal range of motion.     Cervical back: Normal range of motion.  Neurological:     Mental Status: He is alert and oriented to person, place, and time.    Review of Systems  Constitutional: Negative.   HENT: Negative.    Eyes: Negative.   Respiratory: Negative.    Cardiovascular: Negative.   Gastrointestinal: Negative.   Genitourinary: Negative.   Musculoskeletal: Negative.   Skin: Negative.    Neurological: Negative.   Endo/Heme/Allergies: Negative.     Blood pressure (!) 146/100, pulse 91, temperature 98.5 F (36.9 C), temperature source Oral, resp. rate 20, SpO2 98 %. There is no height or weight on file to calculate BMI.  Past Psychiatric History: History of ADHD and MDD. Has only been prescribed adderral in January 2023 but stopped the medication.   Is the patient at risk to self? Yes  Has the patient been a risk to self in the past 6 months? No .    Has the patient been a risk to self within the distant past? Yes   Is the patient a risk to others? No   Has the patient been a risk to others in the past 6 months? No   Has the patient been a risk to others within the distant past? No   Past Medical History:  Past Medical History:  Diagnosis Date   Anxiety and depression    Chronic back pain     Past Surgical History:  Procedure Laterality Date   FOOT SURGERY     right foot    Family History: No family history on file.  Social History:  Social History   Socioeconomic History   Marital status: Single    Spouse name: Not on file  Number of children: Not on file   Years of education: Not on file   Highest education level: Not on file  Occupational History   Not on file  Tobacco Use   Smoking status: Every Day    Packs/day: 0.25    Types: Cigarettes   Smokeless tobacco: Never  Substance and Sexual Activity   Alcohol use: Yes   Drug use: Yes    Types: Marijuana   Sexual activity: Not on file  Other Topics Concern   Not on file  Social History Narrative   Not on file   Social Determinants of Health   Financial Resource Strain: Not on file  Food Insecurity: Not on file  Transportation Needs: Not on file  Physical Activity: Not on file  Stress: Not on file  Social Connections: Not on file  Intimate Partner Violence: Not on file    SDOH:  SDOH Screenings   Alcohol Screen: Not on file  Depression [id number]): Not on file  Financial Resource  Strain: Not on file  Food Insecurity: Not on file  Housing: Not on file  Physical Activity: Not on file  Social Connections: Not on file  Stress: Not on file  Tobacco Use: High Risk (11/11/2020)   Patient History    Smoking Tobacco Use: Every Day    Smokeless Tobacco Use: Never    Passive Exposure: Not on file  Transportation Needs: Not on file    Last Labs:  No visits with results within 6 Month(s) from this visit.  Latest known visit with results is:  Admission on 01/14/2019, Discharged on 01/14/2019  Component Date Value Ref Range Status   Specimen Description 01/14/2019 THROAT   Final   Special Requests 01/14/2019 NONE   Final   Culture 01/14/2019    Final                   Value:NO GROUP A STREP (S.PYOGENES) ISOLATED Performed at [facility], [address], [place], [place] [address]    Report Status 01/14/2019 01/16/2019 FINAL   Final   Streptococcus, Group A Screen (Dir* 01/14/2019 NEGATIVE  NEGATIVE Final    Allergies: Patient has no known allergies.  PTA Medications: (Not in a hospital admission)   Long Term Goals: Improvement in symptoms so as ready for discharge  Short Term Goals: Patient will attend at least of 50% of the groups daily. and Patient will take medications as prescribed daily.  Medical Decision Making  Patient admitted to the [facility] for mood stabilization and alcohol detocx. Patient is voluntary. Reassess depression on 04/22/22 using PHQ 2-9 scale.   Lab Orders         Resp Panel by RT-PCR (Flu A&B, Covid) Anterior Nasal Swab         CBC with Differential/Platelet         Comprehensive metabolic panel         Hemoglobin A1c         Ethanol         Lipid panel         TSH         RPR         HIV Antibody (routine testing w rflx)         POCT Urine Drug Screen - (I-Screen)         POC SARS Coronavirus 2 Ag-ED - Nasal Swab    EKG  Meds ordered this encounter  Medications   acetaminophen (TYLENOL) tablet 650 mg  alum & mag  hydroxide-simeth (MAALOX/MYLANTA) 200-200-20 MG/5ML suspension 30 mL   magnesium hydroxide (MILK OF MAGNESIA) suspension 30 mL   hydrOXYzine (ATARAX) tablet 25 mg   traZODone (DESYREL) tablet 50 mg   thiamine (B-1) injection 100 mg   thiamine tablet 100 mg   multivitamin with minerals tablet 1 tablet   LORazepam (ATIVAN) tablet 1 mg   loperamide (IMODIUM) capsule 2-4 mg   ondansetron (ZOFRAN-ODT) disintegrating tablet 4 mg   Dispo-patient states that [facility] at 11 am.     Recommendations  Based on my evaluation the patient does not appear to have an emergency medical condition.  Marissa Calamity, NP 04/21/22  1:36 PM

## 2022-04-21 NOTE — ED Notes (Signed)
Pt asleep in bed. Respirations even and unlabored. Will continue to monitor for safety. ?

## 2022-04-21 NOTE — ED Notes (Signed)
Meal provided. Tolerated scheduled meds w/o difficulty.

## 2022-04-21 NOTE — ED Notes (Signed)
Pt resting in bed. A&O x4, calm and cooperative. Denies current SI/HI/AVH. Denies any current needs. No signs of acute distress noted. Will continue to monitor for safety. Awaiting PCR COVID result to transfer to [facility].

## 2022-04-21 NOTE — ED Notes (Signed)
Lab called back and stated that they do not have a PCR specimen on pt

## 2022-04-21 NOTE — ED Notes (Signed)
Pt arrived to the unit.

## 2022-04-22 LAB — RPR: RPR Ser Ql: NONREACTIVE

## 2022-04-22 NOTE — ED Notes (Signed)
Pt is admitted to [facility] due to depression and substance abuse. Pt denies  SI, HI, AVH,  Patient stated..... Patient was cooperative during the admission assessment. Skin assessment complete. Belongings in the locker. Patient oriented to unit and unit rules. Meal and drinks offered to patient.  Patient verbalized agreement to treatment plans. Patient verbally contracts for safety while hospitalized. Will monitor for safety.

## 2022-04-22 NOTE — ED Notes (Signed)
Justin Riggs has arrived in [facility]

## 2022-04-22 NOTE — Progress Notes (Signed)
Patient is alert and oriented X 4, denies SI, HI and AVH at this time. Patient complains of anxiety and restlessness. Patient received a PRN dose of hydroxyzine 25 mg tablet. Patient is cooperative and calm but anxious affect, logical and coherent in speech, assertive but not aggressive in interaction. Patient is able to preform all activities of daily living, scheduled medications given. Nursing staff will continue to monitor.

## 2022-04-22 NOTE — ED Provider Notes (Signed)
Behavioral Health Progress Note  Date and Time: 04/22/2022 9:58 AM Name: Justin Riggs MRN:  [id number]  Subjective: 41 year old present to [facility] accompanied by [name] [phone]) with depressive symptoms triggered by relationship complications, homelessness, family and substance abuse. Substance of choice cocaine, alcohol, and THC last use 04/21/2022. Patient reports he's self-medicated to deal with his traumatic events from the past. Hx of SI intent 14 years ago via overdose. Denied homicidal ideations and denied auditory/visual.   Patient seen and evaluated face-to-face by this provider, and chart reviewed. Today, patient denies SI/HI/AVH. There is no objective evidence that the patient is currently responding to internal or external stimuli. He denies depressive symptoms and scored a 1 on the PHQ-2-9 scale. He rates his mood a 4 out of 10 with 10 being the worst due to increased anxiety. He states that he cannot keep his mind still from what happened. He continues to ruminate on his ex-girlfriend cheating on him. He states that he continues to worry about past events. He states that he has been coloring and doing puzzles to help distract her thoughts and is trying to learn how to cope. He states that he does not feel like his current therapist has been helping him get through his feelings. He states that he is hurting on the inside but looks happy on the outside. He states that he is not a depressed person. He states that he does not want to die but is having a hard time coping. He denies alcohol withdrawal symptoms at this time. He reports experiencing hot and cold flashes last nigh, and two loose stools. He verbalizes that he plans to [facility].   Diagnosis:  Final diagnoses:  Substance induced mood disorder ([place])  Cocaine use  Alcohol abuse    Total Time spent with patient: 20 minutes  Past Psychiatric  History: History of ADHD and MDD. Has only been prescribed adderral in January 2023 but stopped the medication.  Past Medical History:  Past Medical History:  Diagnosis Date   Anxiety and depression    Chronic back pain     Past Surgical History:  Procedure Laterality Date   FOOT SURGERY     right foot   Family History: No family history on file. Family Psychiatric  History: Mother and grandmother have a mental health history. Social History:  Social History   Substance and Sexual Activity  Alcohol Use Yes     Social History   Substance and Sexual Activity  Drug Use Yes   Types: Marijuana    Social History   Socioeconomic History   Marital status: Single    Spouse name: Not on file   Number of children: Not on file   Years of education: Not on file   Highest education level: Not on file  Occupational History   Not on file  Tobacco Use   Smoking status: Every Day    Packs/day: 0.25    Types: Cigarettes   Smokeless tobacco: Never  Substance and Sexual Activity   Alcohol use: Yes   Drug use: Yes    Types: Marijuana   Sexual activity: Not on file  Other Topics Concern   Not on file  Social History Narrative   Not on file   Social Determinants of Health   Financial Resource Strain: Not on file  Food Insecurity: Not on file  Transportation Needs: Not on file  Physical Activity: Not on file  Stress: Not on file  Social Connections: Not on file   SDOH:  SDOH Screenings   Alcohol Screen: Not on file  Depression (PHQ2-9): Low Risk  (04/22/2022)   Depression (PHQ2-9)    PHQ-2 Score: 1  Financial Resource Strain: Not on file  Food Insecurity: Not on file  Housing: Not on file  Physical Activity: Not on file  Social Connections: Not on file  Stress: Not on file  Tobacco Use: High Risk (11/11/2020)   Patient History    Smoking Tobacco Use: Every Day    Smokeless Tobacco Use: Never    Passive Exposure: Not on file  Transportation Needs: Not on file    Additional Social History:    Pain Medications: see MAR Prescriptions: see MAR Over the Counter: see MAR History of alcohol / drug use?: Yes Longest period of sobriety (when/how long): n/a Name of Substance 1: cocaine 1 - Age of First Use: 19 1 - Amount (size/oz): varied 1 - Last Use / Amount: 04/20/2022 Name of Substance 2: Alcohol 2 - Age of First Use: 9 2 - Amount (size/oz): varied 2 - Last Use / Amount: 04/20/2022 Name of Substance 3: THC 3 - Age of First Use: 9 3 - Amount (size/oz): varied 3 - Last Use / Amount: 04/20/2022     Current Facility-Administered Medications  Medication Dose Route Frequency Provider Last Rate Last Admin   acetaminophen (TYLENOL) tablet 650 mg  650 mg Oral Q6H PRN Eyan Hagood L, NP       alum & mag hydroxide-simeth (MAALOX/MYLANTA) 200-200-20 MG/5ML suspension 30 mL  30 mL Oral Q4H PRN Missy Baksh L, NP       hydrOXYzine (ATARAX) tablet 25 mg  25 mg Oral TID PRN Baxter Gonzalez L, NP   25 mg at 04/22/22 [id number]   loperamide (IMODIUM) capsule 2-4 mg  2-4 mg Oral PRN Jolayne Branson L, NP       LORazepam (ATIVAN) tablet 1 mg  1 mg Oral Q6H PRN Keymora Grillot L, NP       magnesium hydroxide (MILK OF MAGNESIA) suspension 30 mL  30 mL Oral Daily PRN Rieley Khalsa L, NP       multivitamin with minerals tablet 1 tablet  1 tablet Oral Daily Aviyon Hocevar L, NP   1 tablet at 04/22/22 0903   ondansetron (ZOFRAN-ODT) disintegrating tablet 4 mg  4 mg Oral Q6H PRN Landers Prajapati L, NP       thiamine tablet 100 mg  100 mg Oral Daily Keyanah Kozicki L, NP   100 mg at 04/22/22 [id number]   traZODone (DESYREL) tablet 50 mg  50 mg Oral QHS PRN Joesphine Schemm L, NP       Current Outpatient Medications  Medication Sig Dispense Refill   ibuprofen (ADVIL) 200 MG tablet Take 800 mg by mouth every 6 (six) hours as needed (For back pain).      Labs  Lab Results:  Admission on 04/21/2022  Component Date Value Ref Range Status   SARS Coronavirus 2 by RT PCR 04/21/2022  NEGATIVE  NEGATIVE Final   Comment: (NOTE) SARS-CoV-2 target nucleic acids are NOT DETECTED.  The SARS-CoV-2 RNA is generally detectable in upper respiratory specimens during the acute phase of infection. The lowest concentration of SARS-CoV-2 viral copies this assay can detect is 138 copies/mL. A negative result does not preclude SARS-Cov-2 infection and should not be used as the sole basis for treatment or other patient management decisions. A negative result may occur with  improper specimen collection/handling, submission of specimen other than nasopharyngeal swab, presence of viral mutation(s) within the areas targeted by this assay, and inadequate number of viral copies(<138 copies/mL). A negative result must be combined with clinical observations, patient history, and epidemiological information. The expected result is Negative.  Fact Sheet for Patients:  EntrepreneurPulse.com.au  Fact Sheet for Healthcare Providers:  IncredibleEmployment.be  This test is no                          t yet approved or cleared by the [place] FDA and  has been authorized for detection and/or diagnosis of SARS-CoV-2 by FDA under an Emergency Use Authorization (EUA). This EUA will remain  in effect (meaning this test can be used) for the duration of the COVID-19 declaration under Section 564(b)(1) of the Act, 21 U.S.C.section 360bbb-3(b)(1), unless the authorization is terminated  or revoked sooner.       Influenza A by PCR 04/21/2022 NEGATIVE  NEGATIVE Final   Influenza B by PCR 04/21/2022 NEGATIVE  NEGATIVE Final   Comment: (NOTE) The Xpert Xpress SARS-CoV-2/FLU/RSV plus assay is intended as an aid in the diagnosis of influenza from Nasopharyngeal swab specimens and should not be used as a sole basis for treatment. Nasal washings and aspirates are unacceptable for Xpert Xpress SARS-CoV-2/FLU/RSV testing.  Fact Sheet for  Patients: EntrepreneurPulse.com.au  Fact Sheet for Healthcare Providers: IncredibleEmployment.be  This test is not yet approved or cleared by the [place] FDA and has been authorized for detection and/or diagnosis of SARS-CoV-2 by FDA under an Emergency Use Authorization (EUA). This EUA will remain in effect (meaning this test can be used) for the duration of the COVID-19 declaration under Section 564(b)(1) of the Act, 21 U.S.C. section 360bbb-3(b)(1), unless the authorization is terminated or revoked.  Performed at [facility], [place] [address], [place], [place] [address]    WBC 04/21/2022 8.5  4.0 - 10.5 K/uL Final   RBC 04/21/2022 4.79  4.22 - 5.81 MIL/uL Final   Hemoglobin 04/21/2022 15.1  13.0 - 17.0 g/dL Final   HCT 04/21/2022 45.3  39.0 - 52.0 % Final   MCV 04/21/2022 94.6  80.0 - 100.0 fL Final   MCH 04/21/2022 31.5  26.0 - 34.0 pg Final   MCHC 04/21/2022 33.3  30.0 - 36.0 g/dL Final   RDW 04/21/2022 13.6  11.5 - 15.5 % Final   Platelets 04/21/2022 189  150 - 400 K/uL Final   nRBC 04/21/2022 0.0  0.0 - 0.2 % Final   Neutrophils Relative % 04/21/2022 65  % Final   Neutro Abs 04/21/2022 5.5  1.7 - 7.7 K/uL Final   Lymphocytes Relative 04/21/2022 26  % Final   Lymphs Abs 04/21/2022 2.2  0.7 - 4.0 K/uL Final   Monocytes Relative 04/21/2022 8  % Final   Monocytes Absolute 04/21/2022 0.7  0.1 - 1.0 K/uL Final   Eosinophils Relative 04/21/2022 1  % Final   Eosinophils Absolute 04/21/2022 0.1  0.0 - 0.5 K/uL Final   Basophils Relative 04/21/2022 0  % Final   Basophils Absolute 04/21/2022 0.0  0.0 - 0.1 K/uL Final   Immature Granulocytes 04/21/2022 0  % Final   Abs Immature Granulocytes 04/21/2022 0.01  0.00 - 0.07 K/uL Final   Performed at [facility], [place] [address], [place], [place] [address]   Sodium 04/21/2022 139  135 - 145 mmol/L Final   Potassium 04/21/2022 3.4 (L)  3.5 -  5.1 mmol/L Final   Chloride 04/21/2022 101   98 - 111 mmol/L Final   CO2 04/21/2022 28  22 - 32 mmol/L Final   Glucose, Bld 04/21/2022 81  70 - 99 mg/dL Final   Glucose reference range applies only to samples taken after fasting for at least 8 hours.   BUN 04/21/2022 8  6 - 20 mg/dL Final   Creatinine, Ser 04/21/2022 1.09  0.61 - 1.24 mg/dL Final   Calcium 04/21/2022 9.4  8.9 - 10.3 mg/dL Final   Total Protein 04/21/2022 7.7  6.5 - 8.1 g/dL Final   Albumin 04/21/2022 4.5  3.5 - 5.0 g/dL Final   AST 04/21/2022 23  15 - 41 U/L Final   ALT 04/21/2022 29  0 - 44 U/L Final   Alkaline Phosphatase 04/21/2022 46  38 - 126 U/L Final   Total Bilirubin 04/21/2022 0.7  0.3 - 1.2 mg/dL Final   GFR, Estimated 04/21/2022 >60  >60 mL/min Final   Comment: (NOTE) Calculated using the CKD-EPI Creatinine Equation (2021)    Anion gap 04/21/2022 10  5 - 15 Final   Performed at [facility] [address] [place], [place] [address]   Hgb A1c MFr Bld 04/21/2022 5.7 (H)  4.8 - 5.6 % Final   Comment: (NOTE) Pre diabetes:          5.7%-6.4%  Diabetes:              >6.4%  Glycemic control for   <7.0% adults with diabetes    Mean Plasma Glucose 04/21/2022 116.89  mg/dL Final   Performed at [facility], [place] [address] [place], [place] [address]   Alcohol, Ethyl (B) 04/21/2022 <10  <10 mg/dL Final   Comment: (NOTE) Lowest detectable limit for serum alcohol is 10 mg/dL.  For medical purposes only. Performed at [facility], [place] [address], [place], [place] [address]    Cholesterol 04/21/2022 196  0 - 200 mg/dL Final   Triglycerides 04/21/2022 92  <150 mg/dL Final   HDL 04/21/2022 45  >40 mg/dL Final   Total CHOL/HDL Ratio 04/21/2022 4.4  RATIO Final   VLDL 04/21/2022 18  0 - 40 mg/dL Final   LDL Cholesterol 04/21/2022 133 (H)  0 - 99 mg/dL Final   Comment:        Total Cholesterol/HDL:CHD Risk Coronary Heart Disease Risk Table                     Men   Women  1/2 Average Risk   3.4   3.3  Average Risk       5.0   4.4  2  X Average Risk   9.6   7.1  3 X Average Risk  23.4   11.0        Use the calculated Patient Ratio above and the CHD Risk Table to determine the patient's CHD Risk.        ATP III CLASSIFICATION (LDL):  <100     mg/dL   Optimal  100-129  mg/dL   Near or Above                    Optimal  130-159  mg/dL   Borderline  160-189  mg/dL   High  >190     mg/dL   Very High Performed at [facility] [address], [place], [place] [address]    TSH 04/21/2022 1.920  0.350 - 4.500 uIU/mL Final   Comment: Performed by a 3rd Generation assay with a functional sensitivity of <=0.01 uIU/mL. Performed at [facility], [place] [address], [place], [place] [address]    RPR Ser Ql 04/21/2022 NON REACTIVE  NON REACTIVE Final   Performed at [facility], [place] [address], [place], [place] [address]   POC Amphetamine UR 04/21/2022 None Detected  NONE DETECTED (Cut Off Level 1000 ng/mL) Final   POC Secobarbital (BAR) 04/21/2022 None Detected  NONE DETECTED (Cut Off Level 300 ng/mL) Final   POC Buprenorphine (BUP) 04/21/2022 None Detected  NONE DETECTED (Cut Off Level 10 ng/mL) Final   POC Oxazepam (BZO) 04/21/2022 None Detected  NONE DETECTED (Cut Off Level 300 ng/mL) Final   POC Cocaine UR 04/21/2022 Positive (A)  NONE DETECTED (Cut Off Level 300 ng/mL) Final   POC Methamphetamine UR 04/21/2022 None Detected  NONE DETECTED (Cut Off Level 1000 ng/mL) Final   POC Morphine 04/21/2022 None Detected  NONE DETECTED (Cut Off Level 300 ng/mL) Final   POC Methadone UR 04/21/2022 None Detected  NONE DETECTED (Cut Off Level 300 ng/mL) Final   POC Oxycodone UR 04/21/2022 None Detected  NONE DETECTED (Cut Off Level 100 ng/mL) Final   POC Marijuana UR 04/21/2022 Positive (A)  NONE DETECTED (Cut Off Level 50 ng/mL) Final   SARS Coronavirus 2 Ag 04/21/2022 Negative  Negative Final   HIV Screen 4th Generation wRfx 04/21/2022 Non Reactive  Non Reactive Final   Performed at [facility], [place] [address] [place], [place] [address]    Blood Alcohol level:  Lab Results  Component Value Date   ETH <10 04/21/2022   ETH 45 (H) 123456    Metabolic Disorder Labs: Lab Results  Component Value Date   HGBA1C 5.7 (H) 04/21/2022   MPG 116.89 04/21/2022   No results found for: "PROLACTIN" Lab Results  Component Value Date   CHOL 196 04/21/2022   TRIG 92 04/21/2022   HDL 45 04/21/2022   CHOLHDL 4.4 04/21/2022   VLDL 18 04/21/2022   LDLCALC 133 (H) 04/21/2022   LDLCALC 108 (H) 08/04/2010    Therapeutic Lab Levels: No results found for: "LITHIUM" No results found for: "VALPROATE" No results found for: "CBMZ"  Physical Findings   PHQ2-9    Flowsheet Row ED from 04/21/2022 in [facility]  PHQ-2 Total Score 1      [facility] ED from 04/21/2022 in [facility]        Musculoskeletal  Strength & Muscle Tone: within normal limits Gait & Station: normal Patient leans: N/A  Psychiatric Specialty Exam  Presentation  General Appearance: Appropriate for Environment  Eye Contact:Fair  Speech:Clear and Coherent  Speech Volume:Normal  Handedness:No data recorded  Mood and Affect  Mood:Anxious; Dysphoric  Affect:Congruent   Thought Process  Thought Processes:Coherent; Goal Directed  Descriptions of Associations:Intact  Orientation:Full (Time, Place and Person)  Thought Content:Logical     Hallucinations:Hallucinations: None  Ideas of Reference:None  Suicidal Thoughts:Suicidal Thoughts: No SI Passive Intent and/or Plan: Without Intent; Without Plan  Homicidal Thoughts:Homicidal Thoughts: No   Sensorium  Memory:Immediate Fair; Remote Fair; Recent Fair  Judgment:Fair  Insight:Fair   Executive Functions  Concentration:Fair  Attention Span:Fair  [place]   Psychomotor Activity  Psychomotor Activity:Psychomotor  Activity: Normal   Assets  Assets:Communication Skills; Desire for Improvement; Financial Resources/Insurance; Housing; Leisure  Time; Physical Health; Resilience; Social Support; Transportation; Vocational/Educational   Sleep  Sleep:Sleep: Fair   Nutritional Assessment (For OBS and FBC admissions only) Has the patient had a weight loss or gain of 10 pounds or more in the last 3 months?: No Has the patient had a decrease in food intake/or appetite?: No Does the patient have dental problems?: No Does the patient have eating habits or behaviors that may be indicators of an eating disorder including binging or inducing vomiting?: No Has the patient recently lost weight without trying?: 0 Has the patient been eating poorly because of a decreased appetite?: 0 Malnutrition Screening Tool Score: 0    Physical Exam  Physical Exam HENT:     Head: Normocephalic.     Nose: Nose normal.  Eyes:     Conjunctiva/sclera: Conjunctivae normal.  Cardiovascular:     Rate and Rhythm: Normal rate.  Pulmonary:     Effort: Pulmonary effort is normal.  Musculoskeletal:        General: Normal range of motion.     Cervical back: Normal range of motion.  Neurological:     Mental Status: He is alert and oriented to person, place, and time.    Review of Systems  Constitutional: Negative.   HENT: Negative.    Eyes: Negative.   Respiratory: Negative.    Cardiovascular: Negative.   Gastrointestinal: Negative.   Genitourinary: Negative.   Musculoskeletal: Negative.   Skin: Negative.   Neurological: Negative.   Endo/Heme/Allergies: Negative.    Blood pressure 105/80, pulse 90, temperature (!) 96.6 F (35.9 C), resp. rate 18, SpO2 100 %. There is no height or weight on file to calculate BMI.  Treatment Plan Summary: 41 year old present to [facility] accompanied by [name] [phone]) with depressive symptoms triggered by relationship complications, homelessness, family and  substance abuse. Substance of choice cocaine, alcohol, and THC last use 04/21/2022. Patient reports he's self-medicated to deal with his traumatic events from the past. Hx of SI intent 14 years ago via overdose. Denied homicidal ideations and denied auditory/visual. UDS positive for cocaine and THC. UDS is negative.   SUD:  Psychotherapy to address substance use and current stressors.  -Vistaril 25 mg po TID prn for anxiety.  -Ativan 1 mg po q6h prn for alcohol withdrawal symptoms   Dispo-[facility].   Marissa Calamity, NP 04/22/2022 9:58 AM

## 2022-04-22 NOTE — Discharge Instructions (Addendum)
Discharge recommendations:  Follow up with substance abuse resources for treatment.  Please see information for follow-up appointment with psychiatry and therapy.  Please follow up with your primary care provider for all medical related needs.   Therapy: We recommend that patient participate in individual therapy to address mental health concerns.  Safety:  The patient should abstain from use of illicit substances/drugs and abuse of any medications. If symptoms worsen or do not continue to improve or if the patient becomes actively suicidal or homicidal then it is recommended that the patient return to the closest hospital emergency department, the [facility], or call 911 for further evaluation and treatment. National Suicide Prevention Lifeline 1-800-SUICIDE or 480-678-5792.  About 988 988 offers 24/7 access to trained crisis counselors who can help people experiencing mental health-related distress. People can call or text 988 or chat 988lifeline.org for themselves or if they are worried about a loved one who may need crisis support.   Please contact one of the following facilities to start medication management and therapy services:   [facility] [address] [place] [address]  [place], [place] [address] [phone]   [facility]  [address] [place], [place] [address] [phone]  [facility]  [address] [place], [place] [address] [phone]  [facility]  [address]  [place], [place] [address] [phone]  [facility]  [address] [place], [place] [address] [phone]  [facility]  [address],  [place], [place] [address] [phone]

## 2022-04-22 NOTE — Progress Notes (Signed)
Earlier intervention of hydroxyzine effective as patient now resting quietly without signs of anxiety. Respirations even and unlabored at this time. Nursing staff will continue to monitor.

## 2022-04-22 NOTE — ED Notes (Signed)
Pt is in the bed sleeping. Respirations are even and unlabored. No acute distress noted. Will continue to monitor for safety. 

## 2022-04-22 NOTE — Progress Notes (Signed)
Patient remains calm and cooperative on the unit, denies SI, HI and AVH. Continues to seek treatment for substance abuse. Patient has no acute behaviors at this time. Nursing staff will continue to monitor.

## 2022-04-22 NOTE — ED Provider Notes (Signed)
FBC/OBS ASAP Discharge Summary  Date and Time: 04/22/2022 5:41 PM  Name: Justin Riggs  MRN:  [id number]   Discharge Diagnoses:  Final diagnoses:  Substance induced mood disorder ([place])  Cocaine use  Alcohol abuse    Subjective: 41 year old present to [facility] accompanied by [name] [phone]) with depressive symptoms triggered by relationship complications, homelessness, family and substance abuse. Substance of choice cocaine, alcohol, and THC last use 04/21/2022. Patient reports he's self-medicated to deal with his traumatic events from the past. Hx of SI intent 14 years ago via overdose. Denied homicidal ideations and denied auditory/visual.    Stay Summary: Patient seen and evaluated face-to-face by this provider, and chart reviewed. Today, patient denies SI/HI/AVH. There is no objective evidence that the patient is currently responding to internal or external stimuli. He denies depressive symptoms and scored a 1 on the PHQ-2-9 scale. He rates his mood a 4 out of 10 with 10 being the worst due to increased anxiety. He states that he cannot keep his mind still from what happened. He continues to ruminate on his ex-girlfriend cheating on him. He states that he continues to worry about past events. He states that he has been coloring and doing puzzles to help distract her thoughts and is trying to learn how to cope. He states that he does not feel like his current therapist has been helping him get through his feelings. He states that he is hurting on the inside but looks happy on the outside. He states that he is not a depressed person. He states that he does not want to die but is having a hard time coping. He denies alcohol withdrawal symptoms at this time. He reports experiencing hot and cold flashes last nigh, and two loose stools. He verbalizes that [facility]   Update-Chelsea, CSW,, confirmed that the  [facility]. Chelsea, CSW., spoke with the patient a length about alternative options. Patient declined resources and stated that he will follow up on his own and return back to work. Patient requested to be discharged. Patient was provided with outpatient resources for psychiatry, therapy and substance use treatment.   Total Time spent with patient: 15 minutes  Past Psychiatric History:  Past Medical History:  Past Medical History:  Diagnosis Date   Anxiety and depression    Chronic back pain     Past Surgical History:  Procedure Laterality Date   FOOT SURGERY     right foot   Family History: No family history on file. Family Psychiatric History:  Social History:  Social History   Substance and Sexual Activity  Alcohol Use Yes     Social History   Substance and Sexual Activity  Drug Use Yes   Types: Marijuana    Social History   Socioeconomic History   Marital status: Single    Spouse name: Not on file   Number of children: Not on file   Years of education: Not on file   Highest education level: Not on file  Occupational History   Not on file  Tobacco Use   Smoking status: Every Day    Packs/day: 0.25    Types: Cigarettes   Smokeless tobacco: Never  Substance and Sexual Activity   Alcohol use: Yes   Drug use: Yes    Types: Marijuana   Sexual activity: Not on file  Other Topics Concern  Not on file  Social History Narrative   Not on file   Social Determinants of Health   Financial Resource Strain: Not on file  Food Insecurity: Not on file  Transportation Needs: Not on file  Physical Activity: Not on file  Stress: Not on file  Social Connections: Not on file   SDOH:  SDOH Screenings   Alcohol Screen: Not on file  Depression (PHQ2-9): Low Risk  (04/22/2022)   Depression (PHQ2-9)    PHQ-2 Score: 1  Financial Resource Strain: Not on file  Food Insecurity: Not on file  Housing: Not on file  Physical  Activity: Not on file  Social Connections: Not on file  Stress: Not on file  Tobacco Use: High Risk (11/11/2020)   Patient History    Smoking Tobacco Use: Every Day    Smokeless Tobacco Use: Never    Passive Exposure: Not on file  Transportation Needs: Not on file    Tobacco Cessation:  Prescription not provided because: declined  Current Medications:  Current Facility-Administered Medications  Medication Dose Route Frequency Provider Last Rate Last Admin   acetaminophen (TYLENOL) tablet 650 mg  650 mg Oral Q6H PRN Estuardo Frisbee L, NP       alum & mag hydroxide-simeth (MAALOX/MYLANTA) 200-200-20 MG/5ML suspension 30 mL  30 mL Oral Q4H PRN Laretta Pyatt L, NP       hydrOXYzine (ATARAX) tablet 25 mg  25 mg Oral TID PRN Jilda Kress L, NP   25 mg at 04/22/22 [id number]   loperamide (IMODIUM) capsule 2-4 mg  2-4 mg Oral PRN Kiya Eno L, NP       LORazepam (ATIVAN) tablet 1 mg  1 mg Oral Q6H PRN Evanny Ellerbe L, NP       magnesium hydroxide (MILK OF MAGNESIA) suspension 30 mL  30 mL Oral Daily PRN Katyana Trolinger L, NP       multivitamin with minerals tablet 1 tablet  1 tablet Oral Daily Chinenye Katzenberger L, NP   1 tablet at 04/22/22 0903   ondansetron (ZOFRAN-ODT) disintegrating tablet 4 mg  4 mg Oral Q6H PRN Cinda Hara L, NP       thiamine tablet 100 mg  100 mg Oral Daily Reathel Turi L, NP   100 mg at 04/22/22 0903   traZODone (DESYREL) tablet 50 mg  50 mg Oral QHS PRN Lorain Fettes L, NP       No current outpatient medications on file.    PTA Medications: (Not in a hospital admission)      04/22/2022    7:54 AM  Depression screen PHQ 2/9  Decreased Interest 1  Down, Depressed, Hopeless 0  PHQ - 2 Score 1    Flowsheet Row ED from 04/21/2022 in [place]       Musculoskeletal  Strength & Muscle Tone: within normal limits Gait & Station: normal Patient leans: N/A  Psychiatric Specialty Exam   Presentation  General Appearance: Appropriate for Environment  Eye Contact:Fair  Speech:Clear and Coherent  Speech Volume:Normal  Handedness:No data recorded  Mood and Affect  Mood:Anxious; Dysphoric  Affect:Congruent   Thought Process  Thought Processes:Coherent; Goal Directed  Descriptions of Associations:Intact  Orientation:Full (Time, Place and Person)  Thought Content:Logical     Hallucinations:Hallucinations: None  Ideas of Reference:None  Suicidal Thoughts:Suicidal Thoughts: No SI Passive Intent and/or Plan: Without Intent; Without Plan  Homicidal Thoughts:Homicidal Thoughts: No   Sensorium  Memory:Immediate Fair; Remote Fair; Recent  [place]  Insight:Fair   Executive Functions  Concentration:Fair  Attention Span:Fair  [place]   Psychomotor Activity  Psychomotor Activity:Psychomotor Activity: Normal   Assets  Assets:Communication Skills; Desire for Improvement; Financial Resources/Insurance; Housing; Leisure Time; Physical Health; Resilience; Social Support; Transportation; Vocational/Educational   Sleep  Sleep:Sleep: Fair   Nutritional Assessment (For OBS and FBC admissions only) Has the patient had a weight loss or gain of 10 pounds or more in the last 3 months?: No Has the patient had a decrease in food intake/or appetite?: No Does the patient have dental problems?: No Does the patient have eating habits or behaviors that may be indicators of an eating disorder including binging or inducing vomiting?: No Has the patient recently lost weight without trying?: 0 Has the patient been eating poorly because of a decreased appetite?: 0 Malnutrition Screening Tool Score: 0    Physical Exam  Physical Exam HENT:     Head: Normocephalic.     Nose: Nose normal.  Eyes:     Conjunctiva/sclera: Conjunctivae normal.  Cardiovascular:     Rate and Rhythm: Normal rate.  Pulmonary:      Effort: Pulmonary effort is normal.  Musculoskeletal:        General: Normal range of motion.     Cervical back: Normal range of motion.  Neurological:     Mental Status: He is alert and oriented to person, place, and time.    Review of Systems  Constitutional: Negative.   HENT: Negative.    Eyes: Negative.   Respiratory: Negative.    Cardiovascular: Negative.   Gastrointestinal: Negative.   Genitourinary: Negative.   Musculoskeletal: Negative.   Skin: Negative.   Neurological: Negative.   Endo/Heme/Allergies: Negative.    Blood pressure 105/80, pulse 90, temperature (!) 96.6 F (35.9 C), resp. rate 18, SpO2 100 %. There is no height or weight on file to calculate BMI.  Demographic Factors:  Male  Loss Factors: Loss of significant relationship  Historical Factors: NA  Risk Reduction Factors:   Sense of responsibility to family, Religious beliefs about death, Employed, Positive social support, and Positive coping skills or problem solving skills  Continued Clinical Symptoms:  Alcohol/Substance Abuse/Dependencies  Cognitive Features That Contribute To Risk:  None    Suicide Risk:  Minimal: No identifiable suicidal ideation.  Patients presenting with no risk factors but with morbid ruminations; may be classified as minimal risk based on the severity of the depressive symptoms  Plan Of Care/Follow-up recommendations:  Activity:  as tolerated  Discharge recommendations:  Follow up with substance abuse resources for treatment.  Please see information for follow-up appointment with psychiatry and therapy.  Please follow up with your primary care provider for all medical related needs.   Therapy: We recommend that patient participate in individual therapy to address mental health concerns.  Safety:  The patient should abstain from use of illicit substances/drugs and abuse of any medications. If symptoms worsen or do not continue to improve or if the patient becomes  actively suicidal or homicidal then it is recommended that the patient return to the closest hospital emergency department, the [facility], or call 911 for further evaluation and treatment. National Suicide Prevention Lifeline 1-800-SUICIDE or 225-015-7266.  About 988 988 offers 24/7 access to trained crisis counselors who can help people experiencing mental health-related distress. People can call or text 988 or chat 988lifeline.org for themselves or if they are worried about a loved one  who may need crisis support.   Please contact one of the following facilities to start medication management and therapy services:   [facility] [id number]  [place], [place] [address] [phone]   [place]  [address] [place], [place] [address] [phone]  [place]  [address] [place], [place] [address] [phone]  [facility]  [address]  [place], [place] [address] [phone]  [facility]  [place], [place] [address] [phone]  [place]  [address]  [place], [place] [address] [phone]     Follow-up Information     Call  [facility].   Why: Please contact if you are interested in any outpatient substance abuse services. Contact information: [place],  [address]  Office: [phone]  Fax: [phone]               Disposition: discharge to self/home.  Marissa Calamity, NP 04/22/2022, 5:41 PM

## 2022-04-24 LAB — GC/CHLAMYDIA PROBE AMP (~~LOC~~) NOT AT ARMC
Chlamydia: NEGATIVE
Comment: NEGATIVE
Comment: NORMAL
Neisseria Gonorrhea: NEGATIVE

## 2022-05-03 DIAGNOSIS — F4389 Other reactions to severe stress: Secondary | ICD-10-CM | POA: Diagnosis not present

## 2022-05-10 DIAGNOSIS — F4389 Other reactions to severe stress: Secondary | ICD-10-CM | POA: Diagnosis not present

## 2022-05-25 DIAGNOSIS — F4389 Other reactions to severe stress: Secondary | ICD-10-CM | POA: Diagnosis not present

## 2022-06-01 DIAGNOSIS — F4389 Other reactions to severe stress: Secondary | ICD-10-CM | POA: Diagnosis not present

## 2022-06-08 DIAGNOSIS — F4389 Other reactions to severe stress: Secondary | ICD-10-CM | POA: Diagnosis not present

## 2022-11-18 ENCOUNTER — Emergency Department (HOSPITAL_COMMUNITY): Payer: No Typology Code available for payment source

## 2022-11-18 ENCOUNTER — Telehealth (HOSPITAL_COMMUNITY): Payer: Self-pay | Admitting: Emergency Medicine

## 2022-11-18 ENCOUNTER — Encounter (HOSPITAL_COMMUNITY): Payer: Self-pay

## 2022-11-18 ENCOUNTER — Emergency Department (HOSPITAL_COMMUNITY)
Admission: EM | Admit: 2022-11-18 | Discharge: 2022-11-18 | Disposition: A | Discharge status: Home / Self Care | Payer: No Typology Code available for payment source | Attending: Emergency Medicine | Admitting: Emergency Medicine

## 2022-11-18 DIAGNOSIS — M545 Low back pain, unspecified: Secondary | ICD-10-CM | POA: Insufficient documentation

## 2022-11-18 DIAGNOSIS — M549 Dorsalgia, unspecified: Secondary | ICD-10-CM | POA: Diagnosis present

## 2022-11-18 LAB — CBC WITH DIFFERENTIAL/PLATELET
Abs Immature Granulocytes: 0.01 10*3/uL (ref 0.00–0.07)
Basophils Absolute: 0 10*3/uL (ref 0.0–0.1)
Basophils Relative: 0 %
Eosinophils Absolute: 0.1 10*3/uL (ref 0.0–0.5)
Eosinophils Relative: 1 %
HCT: 42.3 % (ref 39.0–52.0)
Hemoglobin: 14 g/dL (ref 13.0–17.0)
Immature Granulocytes: 0 %
Lymphocytes Relative: 24 %
Lymphs Abs: 1.7 10*3/uL (ref 0.7–4.0)
MCH: 31.2 pg (ref 26.0–34.0)
MCHC: 33.1 g/dL (ref 30.0–36.0)
MCV: 94.2 fL (ref 80.0–100.0)
Monocytes Absolute: 0.6 10*3/uL (ref 0.1–1.0)
Monocytes Relative: 8 %
Neutro Abs: 4.6 10*3/uL (ref 1.7–7.7)
Neutrophils Relative %: 67 %
Platelets: 187 10*3/uL (ref 150–400)
RBC: 4.49 MIL/uL (ref 4.22–5.81)
RDW: 13.8 % (ref 11.5–15.5)
WBC: 6.9 10*3/uL (ref 4.0–10.5)
nRBC: 0 % (ref 0.0–0.2)

## 2022-11-18 LAB — COMPREHENSIVE METABOLIC PANEL
ALT: 39 U/L (ref 0–44)
AST: 21 U/L (ref 15–41)
Albumin: 3.9 g/dL (ref 3.5–5.0)
Alkaline Phosphatase: 43 U/L (ref 38–126)
Anion gap: 6 (ref 5–15)
BUN: 12 mg/dL (ref 6–20)
CO2: 24 mmol/L (ref 22–32)
Calcium: 8.6 mg/dL — ABNORMAL LOW (ref 8.9–10.3)
Chloride: 107 mmol/L (ref 98–111)
Creatinine, Ser: 0.92 mg/dL (ref 0.61–1.24)
GFR, Estimated: >60 mL/min (ref >60)
Glucose, Bld: 87 mg/dL (ref 70–99)
Potassium: 4 mmol/L (ref 3.5–5.1)
Sodium: 137 mmol/L (ref 135–145)
Total Bilirubin: 0.4 mg/dL (ref 0.3–1.2)
Total Protein: 7.2 g/dL (ref 6.5–8.1)

## 2022-11-18 LAB — URINALYSIS, ROUTINE W REFLEX MICROSCOPIC
Bilirubin Urine: NEGATIVE
Glucose, UA: NEGATIVE mg/dL
Hgb urine dipstick: NEGATIVE
Ketones, ur: NEGATIVE mg/dL
Leukocytes,Ua: NEGATIVE
Nitrite: NEGATIVE
Protein, ur: NEGATIVE mg/dL
Specific Gravity, Urine: 1.023 (ref 1.005–1.030)
pH: 6 (ref 5.0–8.0)

## 2022-11-18 LAB — LIPASE, BLOOD: Lipase: 29 U/L (ref 11–51)

## 2022-11-18 MED ORDER — KETOROLAC TROMETHAMINE 15 MG/ML IJ SOLN
15.0000 mg | Freq: Once | INTRAMUSCULAR | Status: AC
  Administered 2022-11-18: 15 mg via INTRAVENOUS
  Filled 2022-11-18: qty 1

## 2022-11-18 MED ORDER — CYCLOBENZAPRINE HCL 10 MG PO TABS: 10.0000 mg | ORAL_TABLET | Freq: Two times a day (BID) | ORAL | 0 refills | Status: DC | PRN

## 2022-11-18 MED ORDER — IBUPROFEN 800 MG PO TABS: 800.0000 mg | ORAL_TABLET | Freq: Three times a day (TID) | ORAL | 0 refills | Status: DC | PRN

## 2022-11-18 MED ORDER — IBUPROFEN 800 MG PO TABS
800.0000 mg | ORAL_TABLET | Freq: Three times a day (TID) | ORAL | 0 refills | Status: DC | PRN
Start: 2022-11-18 — End: 2023-04-30

## 2022-11-18 MED ORDER — SODIUM CHLORIDE 0.9 % IV BOLUS
500.0000 mL | Freq: Once | INTRAVENOUS | Status: AC
  Administered 2022-11-18: 500 mL via INTRAVENOUS

## 2022-11-18 NOTE — ED Triage Notes (Signed)
Pt arrived via POV, c/o back pain, mostly left sided, radiating down leg. Denies any known injury.

## 2022-11-18 NOTE — Telephone Encounter (Signed)
Scripts sent to new pharmacy [facility] on [address] per patient's request.

## 2022-11-18 NOTE — ED Provider Notes (Signed)
[facility]-EMERGENCY DEPT Provider Note   CSN: [id number] Arrival date & time: 11/18/22  0708     History  Chief Complaint  Patient presents with   Back Pain    Antwone L Gladson is a 42 y.o. male.  42 year old male with prior medical history as detailed below presents for evaluation.  Patient complains of left-sided flank pain that radiates into the left hip and left upper leg.  Patient denies any specific known injury.  He does report doing yoga with his girlfriend within the last 48 hours that might have precipitated some of his pain.  Patient reports some radiation of the pain into his left lower quadrant on his abdomen and also into his left groin.  He denies fever.  He denies nausea or vomiting.  He denies change in bowel movements or change in urination.  He reports using some small amount of Tylenol and ibuprofen at home with minimal improvement in symptoms.  Pain is worse with trying to sit up or movement of the left flank and left hip.  The history is provided by the patient and medical records.       Home Medications Prior to Admission medications   Not on File      Allergies    Patient has no known allergies.    Review of Systems   Review of Systems  All other systems reviewed and are negative.   Physical Exam Updated Vital Signs BP (!) 151/111 (BP Location: Right Leg)   Pulse 78   Temp 97.7 F (36.5 C) (Oral)   Resp 18  Physical Exam Vitals and nursing note reviewed.  Constitutional:      General: He is not in acute distress.    Appearance: Normal appearance. He is well-developed.  HENT:     Head: Normocephalic and atraumatic.  Eyes:     Conjunctiva/sclera: Conjunctivae normal.     Pupils: Pupils are equal, round, and reactive to light.  Cardiovascular:     Rate and Rhythm: Normal rate and regular rhythm.     Heart sounds: Normal heart sounds.  Pulmonary:     Effort: Pulmonary effort is normal. No respiratory distress.      Breath sounds: Normal breath sounds.  Abdominal:     General: There is no distension.     Palpations: Abdomen is soft.     Tenderness: There is no abdominal tenderness.  Musculoskeletal:        General: Tenderness present. No deformity. Normal range of motion.     Cervical back: Normal range of motion and neck supple.     Comments: Tenderness with palpation along the left paraspinal muscles.  Tenderness continues down into the left lateral hip.  No overlying erythema or significant ecchymosis noted.  Range of motion of the low back and left hip somewhat decreased secondary to reported pain.  Distal left lower extremity is neurovascular intact.  Skin:    General: Skin is warm and dry.  Neurological:     General: No focal deficit present.     Mental Status: He is alert and oriented to person, place, and time.     ED Results / Procedures / Treatments   Labs (all labs ordered are listed, but only abnormal results are displayed) Labs Reviewed  CBC WITH DIFFERENTIAL/PLATELET  COMPREHENSIVE METABOLIC PANEL  URINALYSIS, ROUTINE W REFLEX MICROSCOPIC  LIPASE, BLOOD    EKG None  Radiology CT ABDOMEN PELVIS WO CONTRAST  Result Date: 11/18/2022 CLINICAL DATA:  Left  flank pain.  Kidney stones suspected. EXAM: CT ABDOMEN AND PELVIS WITHOUT CONTRAST TECHNIQUE: Multidetector CT imaging of the abdomen and pelvis was performed following the standard protocol without IV contrast. RADIATION DOSE REDUCTION: This exam was performed according to the departmental dose-optimization program which includes automated exposure control, adjustment of the mA and/or kV according to patient size and/or use of iterative reconstruction technique. COMPARISON:  None Available. FINDINGS: Lower chest: No acute abnormality. Hepatobiliary: No focal liver abnormality is seen. No gallstones, gallbladder wall thickening, or biliary dilatation. Pancreas: Unremarkable. No pancreatic ductal dilatation or surrounding inflammatory  changes. Spleen: Normal in size without focal abnormality. Adrenals/Urinary Tract: Adrenal glands are unremarkable. Kidneys are normal, without renal calculi, focal lesion, or hydronephrosis. Bladder is unremarkable. Stomach/Bowel: No evidence of obstruction or focal bowel wall thickening. Normal appendix in the right lower quadrant. The terminal ileum is unremarkable. Vascular/Lymphatic: Limited evaluation in the absence of intravenous contrast. No evidence of aneurysm. No suspicious lymphadenopathy. Reproductive: Prostate is unremarkable. Other: No abdominal wall hernia or abnormality. No abdominopelvic ascites. Musculoskeletal: No acute or significant osseous findings. IMPRESSION: No acute abnormality within the abdomen or pelvis to explain the patient's clinical symptoms. Specifically, no evidence of nephrolithiasis. Electronically Signed   By: Jacqulynn Cadet M.D.   On: 11/18/2022 08:18    Procedures Procedures    Medications Ordered in ED Medications  ketorolac (TORADOL) 15 MG/ML injection 15 mg (15 mg Intravenous Given 11/18/22 0830)  sodium chloride 0.9 % bolus 500 mL (500 mLs Intravenous New Bag/Given 11/18/22 0831)    ED Course/ Medical Decision Making/ A&P                           Medical Decision Making Amount and/or Complexity of Data Reviewed Labs: ordered. Radiology: ordered.  Risk Prescription drug management.    Medical Screen Complete  This patient presented to the ED with complaint of left-sided flank and low back pain..  This complaint involves an extensive number of treatment options. The initial differential diagnosis includes, but is not limited to, muscular strain, renal colic, etc.  This presentation is: Acute, Self-Limited, Previously Undiagnosed, Uncertain Prognosis, and Complicated  Patient is presenting with left-sided flank and low back pain.  Patient's symptoms may be related to recent yoga session although he denies specific correlation of  same.  Patient's exam is without red flag symptoms.  Patient without significant weakness to either lower extremity.  Patient is able to ambulate without difficulty.  No perirectal loss of sensation.  No change in bowel or bladder function.  Workup is without acute abnormality.  Patient feels significantly improved after ED evaluation and treatment.  Patient does understand need for close outpatient follow-up.  Strict return precautions given and understood.   Additional history obtained: External records from outside sources obtained and reviewed including prior ED visits and prior Inpatient records.    Lab Tests:  I ordered and personally interpreted labs.  The pertinent results include: CBC, CMP, lipase, UA   Imaging Studies ordered:  I ordered imaging studies including CT abdomen pelvis I independently visualized and interpreted obtained imaging which showed NAD I agree with the radiologist interpretation.   Cardiac Monitoring:  The patient was maintained on a cardiac monitor.  I personally viewed and interpreted the cardiac monitor which showed an underlying rhythm of: NSR    Medicines ordered:  I ordered medication including Toradol for pain Reevaluation of the patient after these medicines showed that the patient: improved  Problem List / ED Course:  Low back pain   Reevaluation:  After the interventions noted above, I reevaluated the patient and found that they have: improved   Disposition:  After consideration of the diagnostic results and the patients response to treatment, I feel that the patent would benefit from close outpatient follow-up.          Final Clinical Impression(s) / ED Diagnoses Final diagnoses:  Acute left-sided low back pain without sciatica    Rx / DC Orders ED Discharge Orders          Ordered    ibuprofen (ADVIL) 800 MG tablet  Every 8 hours PRN        11/18/22 1246    cyclobenzaprine (FLEXERIL) 10 MG tablet  2  times daily PRN        11/18/22 1246              Wynetta Fines, MD 11/18/22 1249

## 2022-11-18 NOTE — ED Notes (Signed)
Opened chart at pts request to have prescriptions sent to [facility].

## 2022-11-18 NOTE — Discharge Instructions (Signed)
Return for any problem.  ?

## 2022-11-18 NOTE — ED Notes (Signed)
Pt aware of the need for a urine sample. Urinal at bedside. 

## 2023-04-14 ENCOUNTER — Other Ambulatory Visit: Payer: Self-pay

## 2023-04-14 ENCOUNTER — Emergency Department (HOSPITAL_COMMUNITY): Payer: 59

## 2023-04-14 ENCOUNTER — Encounter (HOSPITAL_COMMUNITY): Payer: Self-pay

## 2023-04-14 ENCOUNTER — Emergency Department (HOSPITAL_COMMUNITY)
Admission: EM | Admit: 2023-04-14 | Discharge: 2023-04-14 | Disposition: A | Discharge status: Home / Self Care | Payer: 59 | Attending: Emergency Medicine | Admitting: Emergency Medicine

## 2023-04-14 DIAGNOSIS — M25512 Pain in left shoulder: Secondary | ICD-10-CM | POA: Diagnosis not present

## 2023-04-14 DIAGNOSIS — M25521 Pain in right elbow: Secondary | ICD-10-CM | POA: Insufficient documentation

## 2023-04-14 DIAGNOSIS — M25511 Pain in right shoulder: Secondary | ICD-10-CM | POA: Insufficient documentation

## 2023-04-14 DIAGNOSIS — Y92410 Unspecified street and highway as the place of occurrence of the external cause: Secondary | ICD-10-CM | POA: Diagnosis not present

## 2023-04-14 MED ORDER — KETOROLAC TROMETHAMINE 15 MG/ML IJ SOLN
15.0000 mg | Freq: Once | INTRAMUSCULAR | Status: AC
  Administered 2023-04-14: 15 mg via INTRAMUSCULAR
  Filled 2023-04-14: qty 1
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
## 2023-04-14 NOTE — Discharge Instructions (Addendum)
Return for any problem.  Take ibuprofen -600 mg taken orally -every 8 hours.  Please take this with food.  This will help with your muscular soreness.  X-ray obtained of your elbow is without abnormality.

## 2023-04-24 ENCOUNTER — Other Ambulatory Visit: Payer: Self-pay

## 2023-04-24 ENCOUNTER — Encounter (HOSPITAL_COMMUNITY): Payer: Self-pay

## 2023-04-24 ENCOUNTER — Emergency Department (HOSPITAL_COMMUNITY): Payer: 59

## 2023-04-24 ENCOUNTER — Emergency Department (HOSPITAL_COMMUNITY)
Admission: EM | Admit: 2023-04-24 | Discharge: 2023-04-24 | Disposition: A | Discharge status: Home / Self Care | Payer: 59 | Attending: Emergency Medicine | Admitting: Emergency Medicine

## 2023-04-24 DIAGNOSIS — S4981XA Other specified injuries of right shoulder and upper arm, initial encounter: Secondary | ICD-10-CM | POA: Diagnosis not present

## 2023-04-24 DIAGNOSIS — M79601 Pain in right arm: Secondary | ICD-10-CM | POA: Insufficient documentation

## 2023-04-24 DIAGNOSIS — T148XXA Other injury of unspecified body region, initial encounter: Secondary | ICD-10-CM

## 2023-04-24 DIAGNOSIS — M25521 Pain in right elbow: Secondary | ICD-10-CM | POA: Diagnosis not present

## 2023-04-24 MED ORDER — KETOROLAC TROMETHAMINE 30 MG/ML IJ SOLN
30.0000 mg | Freq: Once | INTRAMUSCULAR | Status: AC
  Administered 2023-04-24: 30 mg via INTRAMUSCULAR
  Filled 2023-04-24: qty 1
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
## 2023-04-24 NOTE — Discharge Instructions (Signed)
Please follow-up with your primary care doctor, if you do not have a primary care doctor see the number below.  You can use ibuprofen 800 mg up to 3 times a day as needed for the pain.  As well as supplement with Tylenol.  He should follow-up with PCP, return to the ER if you feel like any kind of loss of color in your arm, increased redness, swelling.  You should make sure that you are trying to avoid any kind of repetitive activities, and rest and use ice.
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
## 2023-04-30 ENCOUNTER — Other Ambulatory Visit: Payer: Self-pay

## 2023-04-30 ENCOUNTER — Emergency Department (HOSPITAL_COMMUNITY)
Admission: EM | Admit: 2023-04-30 | Discharge: 2023-04-30 | Disposition: A | Discharge status: Home / Self Care | Payer: 59 | Attending: Emergency Medicine | Admitting: Emergency Medicine

## 2023-04-30 ENCOUNTER — Encounter (HOSPITAL_COMMUNITY): Payer: Self-pay

## 2023-04-30 DIAGNOSIS — M79601 Pain in right arm: Secondary | ICD-10-CM | POA: Insufficient documentation

## 2023-04-30 MED ORDER — CYCLOBENZAPRINE HCL 10 MG PO TABS: 10.0000 mg | ORAL_TABLET | Freq: Two times a day (BID) | ORAL | 0 refills | Status: AC | PRN

## 2023-04-30 MED ORDER — IBUPROFEN 800 MG PO TABS: 800.0000 mg | ORAL_TABLET | Freq: Three times a day (TID) | ORAL | 0 refills | Status: AC | PRN
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
## 2023-05-02 DIAGNOSIS — M542 Cervicalgia: Secondary | ICD-10-CM | POA: Diagnosis not present

## 2023-05-02 DIAGNOSIS — M25521 Pain in right elbow: Secondary | ICD-10-CM | POA: Diagnosis not present

## 2023-05-09 DIAGNOSIS — M79601 Pain in right arm: Secondary | ICD-10-CM | POA: Diagnosis not present

## 2023-05-15 DIAGNOSIS — M79601 Pain in right arm: Secondary | ICD-10-CM | POA: Diagnosis not present

## 2023-05-16 DIAGNOSIS — M25521 Pain in right elbow: Secondary | ICD-10-CM | POA: Diagnosis not present

## 2023-05-16 DIAGNOSIS — M542 Cervicalgia: Secondary | ICD-10-CM | POA: Diagnosis not present

## 2023-05-21 DIAGNOSIS — R202 Paresthesia of skin: Secondary | ICD-10-CM | POA: Diagnosis not present

## 2023-05-24 ENCOUNTER — Ambulatory Visit: Payer: 59 | Admitting: Internal Medicine

## 2023-06-12 DIAGNOSIS — M542 Cervicalgia: Secondary | ICD-10-CM | POA: Diagnosis not present
